# Patient Record
Sex: Male | Born: 1970 | Race: White | Marital: Single | Smoking: Never smoker
Health system: Southern US, Community
[De-identification: ages and names within clinical notes are randomized; demographics above are authoritative.]

## PROBLEM LIST (undated history)

## (undated) HISTORY — PX: ANKLE SURGERY: SHX546

---

## 2015-12-24 ENCOUNTER — Other Ambulatory Visit: Payer: Self-pay | Admitting: Family Medicine

## 2015-12-24 ENCOUNTER — Ambulatory Visit
Admission: RE | Admit: 2015-12-24 | Discharge: 2015-12-24 | Disposition: A | Discharge status: Home / Self Care | Payer: BC Managed Care – PPO | Source: Ambulatory Visit | Attending: Family Medicine | Admitting: Family Medicine

## 2015-12-24 DIAGNOSIS — R059 Cough, unspecified: Secondary | ICD-10-CM

## 2015-12-24 DIAGNOSIS — R05 Cough: Secondary | ICD-10-CM

## 2017-05-04 ENCOUNTER — Encounter: Payer: Self-pay | Admitting: Pulmonary Disease

## 2017-05-04 ENCOUNTER — Ambulatory Visit (INDEPENDENT_AMBULATORY_CARE_PROVIDER_SITE_OTHER): Payer: BC Managed Care – PPO | Admitting: Pulmonary Disease

## 2017-05-04 ENCOUNTER — Ambulatory Visit (INDEPENDENT_AMBULATORY_CARE_PROVIDER_SITE_OTHER)
Admission: RE | Admit: 2017-05-04 | Discharge: 2017-05-04 | Disposition: A | Discharge status: Home / Self Care | Payer: BC Managed Care – PPO | Source: Ambulatory Visit | Attending: Pulmonary Disease | Admitting: Pulmonary Disease

## 2017-05-04 VITALS — BP 124/78 | HR 67 | Ht 73.0 in | Wt 198.4 lb

## 2017-05-04 DIAGNOSIS — R059 Cough, unspecified: Secondary | ICD-10-CM

## 2017-05-04 DIAGNOSIS — R05 Cough: Secondary | ICD-10-CM | POA: Diagnosis not present

## 2017-05-04 LAB — NITRIC OXIDE: Nitric Oxide: 18

## 2017-05-04 MED ORDER — CHLORPHENIRAMINE MALEATE 4 MG PO TABS: 8.0000 mg | ORAL_TABLET | Freq: Three times a day (TID) | ORAL | 1 refills | Status: DC

## 2017-05-04 MED ORDER — AZELASTINE HCL 0.1 % NA SOLN: 2.0000 | Freq: Every day | NASAL | 12 refills | Status: DC

## 2017-05-04 NOTE — Patient Instructions (Signed)
We will start him chlorpheniramine 8 mg 3 times daily and Astelin nasal spray Get chest x-ray and pulmonary function test for further evaluation of the lung function Follow-up in 1 month.

## 2017-05-04 NOTE — Progress Notes (Signed)
Travis Fuller    [id number]    May 22, 1971  Primary Care Physician:Morrow, Clifton CustardAaron, MD  Referring Physician: Farris HasMorrow, Aaron, MD [address] [place], [place] [address]  Chief complaint: Consult for evaluation of chronic cough  HPI: 46 year old with significant past medical history.  He has complaints of persistent cough for the past 1.5 years.  The cough is nonproductive in nature.  Denies any dyspnea, wheezing, gas production, fevers, chills, hemoptysis He does not have any symptoms of allergies, GERD.Marland Kitchen.  He snores occasionally at night and does not have any symptoms of daytime sleepiness.  Pets: Cat and dog.  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 Exposures: No known exposures, no mold at home.  He has hardwood floors, forced air heating Smoking history: Never smoker, no exposure to secondhand smoke Travel History: Travel to [place] 2 months ago for work.  [place] in [place] for the past [place] years.  Grew up in [place].  Outpatient Encounter Prescriptions as of 05/04/2017  Medication Sig  . acetaminophen (TYLENOL) 500 MG tablet Take 500 mg by mouth every 6 (six) hours as needed.   No facility-administered encounter medications on file as of 05/04/2017.     Allergies as of 05/04/2017  . (Not on File)    No past medical history on file.  Past Surgical History:  Procedure Laterality Date  . ANKLE SURGERY      Family History  Problem Relation Age of Onset  . Breast cancer Maternal Aunt   . Emphysema Maternal Uncle     Social History   Social History  . Marital status: Single    Spouse name: N/A  . Number of children: N/A  . Years of education: N/A   Occupational History  . Not on file.   Social History Main Topics  . Smoking status: Never Smoker  . Smokeless tobacco: Never Used  . Alcohol use Yes     Comment: occ  . Drug use: No  . Sexual activity: Not on file   Other Topics Concern  .  Not on file   Social History Narrative  . No narrative on file    Review of systems: Review of Systems  Constitutional: Negative for fever and chills.  HENT: Negative.   Eyes: Negative for blurred vision.  Respiratory: as per HPI  Cardiovascular: Negative for chest pain and palpitations.  Gastrointestinal: Negative for vomiting, diarrhea, blood per rectum. Genitourinary: Negative for dysuria, urgency, frequency and hematuria.  Musculoskeletal: Negative for myalgias, back pain and joint pain.  Skin: Negative for itching and rash.  Neurological: Negative for dizziness, tremors, focal weakness, seizures and loss of consciousness.  Endo/Heme/Allergies: Negative for environmental allergies.  Psychiatric/Behavioral: Negative for depression, suicidal ideas and hallucinations.  All other systems reviewed and are negative.  Physical Exam: Blood pressure 124/78, pulse 67, height 6\' 1"  (1.854 m), weight 198 lb 6.4 oz (90 kg), SpO2 98 %. Gen:      No acute distress HEENT:  EOMI, sclera anicteric Neck:     No masses; no thyromegaly Lungs:    Clear to auscultation bilaterally; normal respiratory effort CV:         Regular rate and rhythm; no murmurs Abd:      + bowel sounds; soft, non-tender; no palpable masses, no distension Ext:    No edema; adequate peripheral perfusion Skin:      Warm and dry; no rash Neuro: alert and oriented x 3  Psych: normal mood and affect  Data Reviewed: FENO 05/04/17- 18  Chest x-ray 12/24/15-mild infrahilar prominence possible bronchitis. I reviewed the images personally.  Assessment:  Evaluation for chronic cough Suspect upper airway cough syndrome from postnasal drip and/or silent reflux Suspicion for asthma, COPD is low.  FENO is normal We will treat the postnasal drip with chlorpheniramine antihistamine and Astelin nasal spray. Check chest x-ray and PFTs Reevaluate in 1 month.  If his symptoms are persistent we may consider antiacid medication for  silent reflux.  Plan/Recommendations: - Chest x-ray, PFTs - Chlorphentermine, Astelin nasal spray.  Chilton Greathouse MD [facility] Pager [phone] 05/04/2017, 10:22 AM  CC: Farris Has, MD

## 2017-06-14 ENCOUNTER — Ambulatory Visit: Payer: BC Managed Care – PPO | Admitting: Pulmonary Disease

## 2017-07-21 ENCOUNTER — Ambulatory Visit: Payer: BC Managed Care – PPO | Admitting: Pulmonary Disease

## 2017-08-15 ENCOUNTER — Ambulatory Visit: Payer: BC Managed Care – PPO | Admitting: Pulmonary Disease

## 2017-09-13 ENCOUNTER — Ambulatory Visit (INDEPENDENT_AMBULATORY_CARE_PROVIDER_SITE_OTHER): Payer: BC Managed Care – PPO | Admitting: Pulmonary Disease

## 2017-09-13 ENCOUNTER — Ambulatory Visit: Payer: BC Managed Care – PPO | Admitting: Pulmonary Disease

## 2017-09-13 ENCOUNTER — Encounter: Payer: Self-pay | Admitting: Pulmonary Disease

## 2017-09-13 ENCOUNTER — Other Ambulatory Visit (INDEPENDENT_AMBULATORY_CARE_PROVIDER_SITE_OTHER): Payer: BC Managed Care – PPO

## 2017-09-13 VITALS — BP 128/70 | HR 95 | Ht 73.0 in | Wt 200.0 lb

## 2017-09-13 DIAGNOSIS — R059 Cough, unspecified: Secondary | ICD-10-CM

## 2017-09-13 DIAGNOSIS — R05 Cough: Secondary | ICD-10-CM

## 2017-09-13 LAB — PULMONARY FUNCTION TEST
DL/VA % PRED: 92 %
DL/VA: 4.38 ml/min/mmHg/L
DLCO UNC % PRED: 105 %
DLCO unc: 35.9 ml/min/mmHg
FEF 25-75 Post: 5.31 L/sec
FEF 25-75 Pre: 3.27 L/sec
FEF2575-%CHANGE-POST: 62 %
FEF2575-%PRED-POST: 143 %
FEF2575-%Pred-Pre: 88 %
FEV1-%CHANGE-POST: 14 %
FEV1-%PRED-PRE: 104 %
FEV1-%Pred-Post: 119 %
FEV1-POST: 4.97 L
FEV1-Pre: 4.33 L
FEV1FVC-%CHANGE-POST: 4 %
FEV1FVC-%Pred-Pre: 94 %
FEV6-%Change-Post: 9 %
FEV6-%Pred-Post: 123 %
FEV6-%Pred-Pre: 113 %
FEV6-PRE: 5.82 L
FEV6-Post: 6.36 L
FEV6FVC-%Change-Post: 0 %
FEV6FVC-%PRED-PRE: 102 %
FEV6FVC-%Pred-Post: 102 %
FVC-%CHANGE-POST: 9 %
FVC-%PRED-POST: 120 %
FVC-%PRED-PRE: 110 %
FVC-PRE: 5.87 L
FVC-Post: 6.43 L
POST FEV1/FVC RATIO: 77 %
PRE FEV6/FVC RATIO: 99 %
Post FEV6/FVC ratio: 99 %
Pre FEV1/FVC ratio: 74 %
RV % PRED: 104 %
RV: 2.16 L
TLC % pred: 123 %
TLC: 8.93 L

## 2017-09-13 LAB — CBC WITH DIFFERENTIAL/PLATELET
BASOS PCT: 0.6 % (ref 0.0–3.0)
Basophils Absolute: 0 10*3/uL (ref 0.0–0.1)
EOS ABS: 0.1 10*3/uL (ref 0.0–0.7)
EOS PCT: 1.4 % (ref 0.0–5.0)
HEMATOCRIT: 43.2 % (ref 39.0–52.0)
Hemoglobin: 14.7 g/dL (ref 13.0–17.0)
LYMPHS PCT: 34.4 % (ref 12.0–46.0)
Lymphs Abs: 2.6 10*3/uL (ref 0.7–4.0)
MCHC: 34.1 g/dL (ref 30.0–36.0)
MCV: 92.2 fl (ref 78.0–100.0)
Monocytes Absolute: 0.7 10*3/uL (ref 0.1–1.0)
Monocytes Relative: 9 % (ref 3.0–12.0)
NEUTROS ABS: 4.1 10*3/uL (ref 1.4–7.7)
Neutrophils Relative %: 54.6 % (ref 43.0–77.0)
PLATELETS: 284 10*3/uL (ref 150.0–400.0)
RBC: 4.69 Mil/uL (ref 4.22–5.81)
RDW: 13.3 % (ref 11.5–15.5)
WBC: 7.5 10*3/uL (ref 4.0–10.5)

## 2017-09-13 LAB — NITRIC OXIDE: Nitric Oxide: 21

## 2017-09-13 MED ORDER — FLUTICASONE FUROATE-VILANTEROL 200-25 MCG/INH IN AEPB: 1.0000 | INHALATION_SPRAY | Freq: Every day | RESPIRATORY_TRACT | 6 refills | Status: AC

## 2017-09-13 MED ORDER — FLUTICASONE FUROATE-VILANTEROL 200-25 MCG/INH IN AEPB: 1.0000 | INHALATION_SPRAY | Freq: Every day | RESPIRATORY_TRACT | 0 refills | Status: AC

## 2017-09-13 NOTE — Progress Notes (Signed)
Patient completed full PFT today. 

## 2017-09-13 NOTE — Progress Notes (Signed)
Travis AuDaniel Fuller    [id number]    06-14-1971  Primary Care Physician:Morrow, Clifton CustardAaron, MD  Referring Physician: Farris HasMorrow, Aaron, MD [address] [place], [place] [address]  Chief complaint: Follow up for chronic cough  HPI: 47 year old with significant past medical history.  He has complaints of persistent cough for the past 1.5 years.  The cough is nonproductive in nature.  Denies any dyspnea, wheezing, gas production, fevers, chills, hemoptysis He does not have any symptoms of allergies, GERD.Marland Kitchen.  He snores occasionally at night and does not have any symptoms of daytime sleepiness.  Pets: Cat and dog.  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 Exposures: No known exposures, no mold at home.  He has hardwood floors, forced air heating Smoking history: Never smoker, no exposure to secondhand smoke Travel History: Travel to [place] 2 months ago for work.  [place] in [place] for the past [place] years.  Grew up in [place].  Interim history: Has tried chlorpheniramine and Astelin nasal spray and reports small improvement in cough but the symptoms persist Denies any dyspnea, wheezing, mucus production.  Outpatient Encounter Medications as of 09/13/2017  Medication Sig  . acetaminophen (TYLENOL) 500 MG tablet Take 500 mg by mouth every 6 (six) hours as needed.  Marland Kitchen. azelastine (ASTELIN) 0.1 % nasal spray Place 2 sprays into both nostrils daily. Use in each nostril as directed  . chlorpheniramine (CHLORPHEN) 4 MG tablet Take 2 tablets (8 mg total) by mouth 3 (three) times daily. (Patient not taking: Reported on 09/13/2017)   No facility-administered encounter medications on file as of 09/13/2017.     Allergies as of 09/13/2017  . (Not on File)    No past medical history on file.  Past Surgical History:  Procedure Laterality Date  . ANKLE SURGERY      Family History  Problem Relation Age of Onset  . Breast cancer Maternal Aunt    . Emphysema Maternal Uncle     Social History   Socioeconomic History  . Marital status: Single    Spouse name: Not on file  . Number of children: Not on file  . Years of education: Not on file  . Highest education level: Not on file  Social Needs  . Financial resource strain: Not on file  . Food insecurity - worry: Not on file  . Food insecurity - inability: Not on file  . Transportation needs - medical: Not on file  . Transportation needs - non-medical: Not on file  Occupational History  . Not on file  Tobacco Use  . Smoking status: Never Smoker  . Smokeless tobacco: Never Used  Substance and Sexual Activity  . Alcohol use: Yes    Comment: occ  . Drug use: No  . Sexual activity: Not on file  Other Topics Concern  . Not on file  Social History Narrative  . Not on file    Review of systems: Review of Systems  Constitutional: Negative for fever and chills.  HENT: Negative.   Eyes: Negative for blurred vision.  Respiratory: as per HPI  Cardiovascular: Negative for chest pain and palpitations.  Gastrointestinal: Negative for vomiting, diarrhea, blood per rectum. Genitourinary: Negative for dysuria, urgency, frequency and hematuria.  Musculoskeletal: Negative for myalgias, back pain and joint pain.  Skin: Negative for itching and rash.  Neurological: Negative for dizziness, tremors, focal weakness, seizures and loss of consciousness.  Endo/Heme/Allergies: Negative for environmental allergies.  Psychiatric/Behavioral:  Negative for depression, suicidal ideas and hallucinations.  All other systems reviewed and are negative.  Physical Exam: Blood pressure 128/70, pulse 95, height 6\' 1"  (1.854 m), weight 200 lb (90.7 kg), SpO2 96 %. Gen:      No acute distress HEENT:  EOMI, sclera anicteric Neck:     No masses; no thyromegaly Lungs:    Clear to auscultation bilaterally; normal respiratory effort CV:         Regular rate and rhythm; no murmurs Abd:      + bowel  sounds; soft, non-tender; no palpable masses, no distension Ext:    No edema; adequate peripheral perfusion Skin:      Warm and dry; no rash Neuro: alert and oriented x 3 Psych: normal mood and affect  Data Reviewed: FENO 05/04/17- 18 FENO 09/13/17- 21  Chest x-ray 12/24/15-mild infrahilar prominence possible bronchitis. Chest x-ray 05/04/17-no active cardiopulmonary disease I reviewed the images personally.  PFTs 09/13/17 FVC 6.43 [120%], FEV1 4.97 [119%], F/F 77, TLC 123%, DLCO 105% Small airways disease with improvement in flow rates post bronchodilator.  Assessment:  Evaluation for chronic cough, asthmatic bronchitis Has tried antihistamine and Astelin nasal spray without significant improvement PFTs reviewed which show small airways disease with bronchodilator response suggestive of reactive airway disease, asthma We will try him on Breo inhaler, check CBC differential and blood allergy profile He does not have symptoms of acid reflux hence we will defer antiacid therapy for now.  Plan/Recommendations: - Check CBC differential, blood allergy profile - Start Breo inhaler.  Chilton Greathouse MD [facility] Pager [phone] 09/13/2017, 3:31 PM  CC: Farris Has, MD

## 2017-09-13 NOTE — Patient Instructions (Signed)
Started on Breo 200 Your lung function test indicate that you may have mild asthma We will get some blood test today including CBC differential and a blood allergy profile to further evaluate asthma Follow-up in 3 months.

## 2017-09-14 LAB — RESPIRATORY ALLERGY PROFILE REGION II ~~LOC~~
Allergen, D pternoyssinus,d7: 1.17 kU/L — ABNORMAL HIGH
Allergen, Mouse Urine Protein, e78: <0.1 kU/L
Allergen, Oak,t7: <0.1 kU/L
Bermuda Grass: <0.1 kU/L
CLASS: 0
CLASS: 0
CLASS: 0
CLASS: 0
CLASS: 0
CLASS: 0
CLASS: 0
CLASS: 0
CLASS: 0
CLASS: 2
CLASS: 2
COMMON RAGWEED (SHORT) (W1) IGE: <0.1 kU/L
Class: 0
Class: 0
Class: 0
Class: 0
Class: 0
Class: 0
Class: 0
Class: 0
Class: 0
Class: 0
Class: 0
Class: 0
Class: 0
Cockroach: <0.1 kU/L
D. FARINAE: 1.37 kU/L — AB
IGE (IMMUNOGLOBULIN E), SERUM: 12 kU/L (ref <=114)
Pecan/Hickory Tree IgE: <0.1 kU/L
Sheep Sorrel IgE: <0.1 kU/L
Timothy Grass: <0.1 kU/L

## 2017-09-14 LAB — INTERPRETATION:

## 2017-12-04 ENCOUNTER — Encounter (HOSPITAL_COMMUNITY): Payer: Self-pay | Admitting: *Deleted

## 2017-12-04 ENCOUNTER — Emergency Department (HOSPITAL_COMMUNITY): Payer: BC Managed Care – PPO

## 2017-12-04 ENCOUNTER — Emergency Department (HOSPITAL_COMMUNITY)
Admission: EM | Admit: 2017-12-04 | Discharge: 2017-12-04 | Disposition: A | Discharge status: Home / Self Care | Payer: BC Managed Care – PPO | Attending: Emergency Medicine | Admitting: Emergency Medicine

## 2017-12-04 DIAGNOSIS — K5732 Diverticulitis of large intestine without perforation or abscess without bleeding: Secondary | ICD-10-CM | POA: Diagnosis not present

## 2017-12-04 DIAGNOSIS — R109 Unspecified abdominal pain: Secondary | ICD-10-CM | POA: Diagnosis present

## 2017-12-04 LAB — COMPREHENSIVE METABOLIC PANEL
ALBUMIN: 4.3 g/dL (ref 3.5–5.0)
ALK PHOS: 37 U/L — AB (ref 38–126)
ALT: 15 U/L — ABNORMAL LOW (ref 17–63)
ANION GAP: 9 (ref 5–15)
AST: 17 U/L (ref 15–41)
BUN: 16 mg/dL (ref 6–20)
CO2: 23 mmol/L (ref 22–32)
Calcium: 9.4 mg/dL (ref 8.9–10.3)
Chloride: 105 mmol/L (ref 101–111)
Creatinine, Ser: 0.89 mg/dL (ref 0.61–1.24)
GFR calc Af Amer: >60 mL/min (ref >60)
GFR calc non Af Amer: >60 mL/min (ref >60)
GLUCOSE: 118 mg/dL — AB (ref 65–99)
POTASSIUM: 4.3 mmol/L (ref 3.5–5.1)
SODIUM: 137 mmol/L (ref 135–145)
Total Bilirubin: 0.6 mg/dL (ref 0.3–1.2)
Total Protein: 7.1 g/dL (ref 6.5–8.1)

## 2017-12-04 LAB — CBC WITH DIFFERENTIAL/PLATELET
BASOS ABS: 0 10*3/uL (ref 0.0–0.1)
Basophils Relative: 0 %
Eosinophils Absolute: 0.2 10*3/uL (ref 0.0–0.7)
Eosinophils Relative: 1 %
HCT: 40.5 % (ref 39.0–52.0)
HEMOGLOBIN: 13.9 g/dL (ref 13.0–17.0)
LYMPHS PCT: 10 %
Lymphs Abs: 1.5 10*3/uL (ref 0.7–4.0)
MCH: 31.8 pg (ref 26.0–34.0)
MCHC: 34.3 g/dL (ref 30.0–36.0)
MCV: 92.7 fL (ref 78.0–100.0)
MONO ABS: 1.8 10*3/uL — AB (ref 0.1–1.0)
Monocytes Relative: 12 %
NEUTROS ABS: 11.6 10*3/uL — AB (ref 1.7–7.7)
Neutrophils Relative %: 77 %
PLATELETS: 305 10*3/uL (ref 150–400)
RBC: 4.37 MIL/uL (ref 4.22–5.81)
RDW: 12.7 % (ref 11.5–15.5)
WBC: 15.1 10*3/uL — ABNORMAL HIGH (ref 4.0–10.5)

## 2017-12-04 LAB — LIPASE, BLOOD: Lipase: 32 U/L (ref 11–51)

## 2017-12-04 MED ORDER — OXYCODONE-ACETAMINOPHEN 5-325 MG PO TABS: 1.0000 | ORAL_TABLET | ORAL | 0 refills | Status: AC | PRN

## 2017-12-04 MED ORDER — KETOROLAC TROMETHAMINE 15 MG/ML IJ SOLN
15.0000 mg | Freq: Once | INTRAMUSCULAR | Status: AC
  Administered 2017-12-04: 15 mg via INTRAVENOUS
  Filled 2017-12-04: qty 1

## 2017-12-04 MED ORDER — METRONIDAZOLE 500 MG PO TABS
500.0000 mg | ORAL_TABLET | Freq: Once | ORAL | Status: AC
  Administered 2017-12-04: 500 mg via ORAL
  Filled 2017-12-04: qty 1

## 2017-12-04 MED ORDER — CIPROFLOXACIN HCL 500 MG PO TABS
500.0000 mg | ORAL_TABLET | Freq: Two times a day (BID) | ORAL | 0 refills | Status: AC
Start: 2017-12-04 — End: ?

## 2017-12-04 MED ORDER — ONDANSETRON HCL 4 MG/2ML IJ SOLN
4.0000 mg | Freq: Once | INTRAMUSCULAR | Status: AC
  Administered 2017-12-04: 4 mg via INTRAVENOUS
  Filled 2017-12-04: qty 2

## 2017-12-04 MED ORDER — IOPAMIDOL (ISOVUE-300) INJECTION 61%
INTRAVENOUS | Status: AC
  Filled 2017-12-04: qty 100

## 2017-12-04 MED ORDER — HYDROMORPHONE HCL 1 MG/ML IJ SOLN
1.0000 mg | Freq: Once | INTRAMUSCULAR | Status: AC
  Administered 2017-12-04: 1 mg via INTRAVENOUS
  Filled 2017-12-04: qty 1

## 2017-12-04 MED ORDER — METRONIDAZOLE 500 MG PO TABS: 500.0000 mg | ORAL_TABLET | Freq: Three times a day (TID) | ORAL | 0 refills | Status: AC

## 2017-12-04 MED ORDER — CIPROFLOXACIN HCL 500 MG PO TABS
500.0000 mg | ORAL_TABLET | Freq: Once | ORAL | Status: AC
  Administered 2017-12-04: 500 mg via ORAL
  Filled 2017-12-04: qty 1

## 2017-12-04 MED ORDER — IOPAMIDOL (ISOVUE-300) INJECTION 61%
100.0000 mL | Freq: Once | INTRAVENOUS | Status: AC | PRN
Start: 2017-12-04 — End: 2017-12-04
  Administered 2017-12-04: 100 mL via INTRAVENOUS

## 2017-12-04 MED ORDER — MORPHINE SULFATE (PF) 4 MG/ML IV SOLN
4.0000 mg | Freq: Once | INTRAVENOUS | Status: AC
  Administered 2017-12-04: 4 mg via INTRAVENOUS
  Filled 2017-12-04: qty 1

## 2017-12-04 NOTE — Discharge Instructions (Signed)
Please take Cipro 500mg  twice daily and Flagyl three times daily for the next week Follow a clear liquid/soft diet for the next several days until your symptoms improve Take Percocet as needed for severe pain. This medicine can make you drowsy so do not drive while taking this. Follow up with GI Return if worsening

## 2017-12-04 NOTE — ED Notes (Signed)
ED Provider at bedside. 

## 2017-12-04 NOTE — ED Provider Notes (Signed)
[facility]-EMERGENCY DEPT Provider Note   CSN: [id number] Arrival date & time: 12/04/17  0711     History   Chief Complaint Chief Complaint  Patient presents with  . Abdominal Pain    HPI Travis Fuller is a 47 y.o. male who presents with abdominal pain.  No significant past medical history.  Patient states that yesterday he started having gradually worsening left lower quadrant pain.  It is sharp in nature and feels like he did a lot of sit ups.  He has never had pain like this before.  He cannot get in a comfortable position last night and felt feverish and nauseous.  This morning the pain did not improve therefore he decided to come to the emergency department.  He tried Tums with no relief.  The pain does not radiate.  He denies chest pain, shortness of breath, vomiting, diarrhea, constipation, urinary symptoms, testicular pain.  He denies history of prior abdominal surgeries or colonoscopy.  HPI  History reviewed. No pertinent past medical history.  There are no active problems to display for this patient.   Past Surgical History:  Procedure Laterality Date  . ANKLE SURGERY          Home Medications    Prior to Admission medications   Medication Sig Start Date End Date Taking? Authorizing Provider  acetaminophen (TYLENOL) 500 MG tablet Take 500 mg by mouth every 6 (six) hours as needed.    [provider]  azelastine (ASTELIN) 0.1 % nasal spray Place 2 sprays into both nostrils daily. Use in each nostril as directed 05/04/17   Mannam, Colbert CoyerPraveen, MD  chlorpheniramine (CHLORPHEN) 4 MG tablet Take 2 tablets (8 mg total) by mouth 3 (three) times daily. Patient not taking: Reported on 09/13/2017 05/04/17   Chilton GreathouseMannam, Praveen, MD  fluticasone furoate-vilanterol (BREO ELLIPTA) 200-25 MCG/INH AEPB Inhale 1 puff into the lungs daily. 09/13/17   Chilton GreathouseMannam, Praveen, MD    Family History Family History  Problem Relation Age of Onset  . Breast cancer Maternal  Aunt   . Emphysema Maternal Uncle     Social History Social History   Tobacco Use  . Smoking status: Never Smoker  . Smokeless tobacco: Never Used  Substance Use Topics  . Alcohol use: Yes    Comment: occ  . Drug use: No     Allergies   Patient has no allergy information on record.   Review of Systems Review of Systems  Constitutional: Positive for fever.  Respiratory: Negative for shortness of breath.   Cardiovascular: Negative for chest pain.  Gastrointestinal: Positive for abdominal pain and nausea. Negative for diarrhea and vomiting.  Genitourinary: Negative for dysuria, flank pain, hematuria and testicular pain.  All other systems reviewed and are negative.    Physical Exam Updated Vital Signs BP 122/73 (BP Location: Left Arm)   Pulse 93   Temp 99.2 F (37.3 C) (Oral)   Resp 20   Ht 6\' 2"  (1.88 m)   Wt 90.7 kg (200 lb)   SpO2 98%   BMI 25.68 kg/m   Physical Exam  Constitutional: He is oriented to person, place, and time. He appears well-developed and well-nourished. No distress.  HENT:  Head: Normocephalic and atraumatic.  Eyes: Pupils are equal, round, and reactive to light. Conjunctivae are normal. Right eye exhibits no discharge. Left eye exhibits no discharge. No scleral icterus.  Neck: Normal range of motion.  Cardiovascular: Normal rate and regular rhythm.  Pulmonary/Chest: Effort normal and breath sounds  normal. No respiratory distress.  Abdominal: Soft. Bowel sounds are normal. He exhibits no distension. There is tenderness (LLQ tenderness).  Genitourinary:  Genitourinary Comments: No inguinal lymphadenopathy or inguinal hernia noted. Normal circumcised penis free of lesions or rash. Testicles are nontender with normal lie. Normal scrotal appearance. No obvious discharge noted. Chaperone present during exam.   Neurological: He is alert and oriented to person, place, and time.  Skin: Skin is warm and dry.  Psychiatric: He has a normal mood and  affect. His behavior is normal.  Nursing note and vitals reviewed.    ED Treatments / Results  Labs (all labs ordered are listed, but only abnormal results are displayed) Labs Reviewed  CBC WITH DIFFERENTIAL/PLATELET - Abnormal; Notable for the following components:      Result Value   WBC 15.1 (*)    Neutro Abs 11.6 (*)    Monocytes Absolute 1.8 (*)    All other components within normal limits  COMPREHENSIVE METABOLIC PANEL - Abnormal; Notable for the following components:   Glucose, Bld 118 (*)    ALT 15 (*)    Alkaline Phosphatase 37 (*)    All other components within normal limits  LIPASE, BLOOD    EKG None  Radiology Ct Abdomen Pelvis W Contrast  Result Date: 12/04/2017 CLINICAL DATA:  Left lower quadrant abdominal pain since yesterday evening. EXAM: CT ABDOMEN AND PELVIS WITH CONTRAST TECHNIQUE: Multidetector CT imaging of the abdomen and pelvis was performed using the standard protocol following bolus administration of intravenous contrast. CONTRAST:  ISOVUE-300 IOPAMIDOL (ISOVUE-300) INJECTION 61% COMPARISON:  None. FINDINGS: Lower chest: Limited visualization of the lower thorax demonstrates minimal dependent subpleural ground-glass atelectasis. No discrete focal airspace opacities. No pleural effusion. Normal heart size.  No pericardial effusion. Hepatobiliary: Normal hepatic contour. No discrete hepatic lesions. No intra extrahepatic biliary duct dilatation. No ascites. Normal appearance of the gallbladder given degree distention. No radiopaque gallstones. No intra or extrahepatic biliary ductal dilatation. Pancreas: Normal appearance of the pancreas Spleen: Note is made of a punctate (approximately 1.3 cm) hypoattenuating (15 Hounsfield unit) cyst within the inferior aspect of the spleen (coronal image 73, series 5). No perisplenic stranding Adrenals/Urinary Tract: There is symmetric enhancement and excretion of the bilateral kidneys. No definite renal stones on this  postcontrast examination. No discrete renal lesions. No urine obstruction or perinephric stranding. Normal appearance of the bilateral adrenal glands. Normal appearance of the urinary bladder given degree of distention. Stomach/Bowel: Scattered colonic diverticulosis with ill-defined stranding adjacent to a prominent diverticuli within the distal descending colon/proximal sigmoid colon within the left lower abdomen/upper pelvis (image 66, series 2). No definable/drainable fluid collection. No pneumoperitoneum, pneumatosis or portal venous gas. Moderate colonic stool burden without evidence of enteric obstruction. Normal appearance of the terminal ileum and appendix. Vascular/Lymphatic: Normal caliber of the abdominal aorta. The major branch vessels of the abdominal aorta appear widely patent on this non CTA examination. Scattered retroperitoneal and mesenteric lymph nodes are numerous though individually not enlarged by size criteria with index aortocaval lymph node measuring 0.7 cm in greatest short axis diameter (image 40, series 2) and index mesenteric lymph node measuring 0.6 cm (image 48, series 2), presumably reactive in etiology. No bulky retroperitoneal, mesenteric, pelvic or inguinal lymphadenopathy. Reproductive: Normal appearance the prostate gland. Several phleboliths are seen with the lower pelvis bilaterally. No free fluid the pelvic cul-de-sac. Other: Regional soft tissues appear normal. Musculoskeletal: No acute or aggressive osseous abnormalities. Moderate to severe multilevel lumbar spine DDD, worse at  L4-L5 with disc space height loss, endplate irregularity and sclerosis. Bilateral facet degenerative change within the lower lumbar spine. IMPRESSION: 1. Findings compatible with acute uncomplicated diverticulitis involving the distal descending/proximal sigmoid colon within the left lower abdomen/upper pelvis. No evidence of perforation or definable/drainable fluid collection. No evidence of enteric  obstruction. 2. 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 Electronically Signed   By: Simonne Come M.D.   On: 12/04/2017 09:04    Procedures Procedures (including critical care time)  Medications Ordered in ED Medications  morphine 4 MG/ML injection 4 mg (4 mg Intravenous Given 12/04/17 0818)  ondansetron (ZOFRAN) injection 4 mg (4 mg Intravenous Given 12/04/17 0818)  iopamidol (ISOVUE-300) 61 % injection 100 mL (100 mLs Intravenous Contrast Given 12/04/17 0845)  HYDROmorphone (DILAUDID) injection 1 mg (1 mg Intravenous Given 12/04/17 0920)  ketorolac (TORADOL) 15 MG/ML injection 15 mg (15 mg Intravenous Given 12/04/17 0920)  metroNIDAZOLE (FLAGYL) tablet 500 mg (500 mg Oral Given 12/04/17 0925)  ciprofloxacin (CIPRO) tablet 500 mg (500 mg Oral Given 12/04/17 0925)     Initial Impression / Assessment and Plan / ED Course  I have reviewed the triage vital signs and the nursing notes.  Pertinent labs & imaging results that were available during my care of the patient were reviewed by me and considered in my medical decision making (see chart for details).  47 year old male presents with acute onset of left lower quadrant pain starting last night.  His vital signs are normal.  He has significant tenderness in the left lower quadrant.  Will obtain labs, provide pain control, obtain CT.   CBC is remarkable for leukocytosis of 15.1.  CMP is normal.  Lipase is normal.  He is still having significant pain after morphine.  Will give another round of pain medicine.  CT is remarkable for uncomplicated diverticulitis.  After second round of pain medicines he feels much more comfortable.  He was given a dose of Cipro and Flagyl in the ED and tolerated p.o.  He was given prescription for antibiotics and pain medicine for home. He was instructed to follow a clear liquid/soft diet for several days. He was given return precautions and referral to GI.  Final Clinical Impressions(s) / ED Diagnoses     Final diagnoses:  Diverticulitis large intestine w/o perforation or abscess w/o bleeding    ED Discharge Orders    None       Bethel Born, PA-C 12/04/17 1559    Gerhard Munch, MD 12/04/17 [phone]

## 2017-12-04 NOTE — ED Triage Notes (Signed)
Pt complains of LLQ pain since yesterday evening. Pt denies emesis/diarrhea/blood in stool. Last bowel movement was normal last night.

## 2017-12-04 NOTE — ED Notes (Signed)
Patient transported to CT 

## 2017-12-19 ENCOUNTER — Ambulatory Visit: Payer: BC Managed Care – PPO | Admitting: Pulmonary Disease

## 2019-07-09 IMAGING — CT CT ABD-PELV W/ CM
2 of 5 series · 15 of 46 positions shown, 17 images · IV contrast (ISOVUE)
Comparison: None.

CLINICAL DATA: Left lower quadrant abdominal pain since yesterday
evening.

EXAM:
CT ABDOMEN AND PELVIS WITH CONTRAST
TECHNIQUE: Multidetector CT imaging of the abdomen and pelvis was performed
using the standard protocol following bolus administration of
intravenous contrast.
CONTRAST:  100mL AMT9OT-9BB IOPAMIDOL (AMT9OT-9BB) INJECTION 61%

[Series 2: axial st · axial · 0.87mm/px · z∈[-628,-158]mm · 12 of 110 slices shown, 14 images]
[im 8/110  soft-tissue]
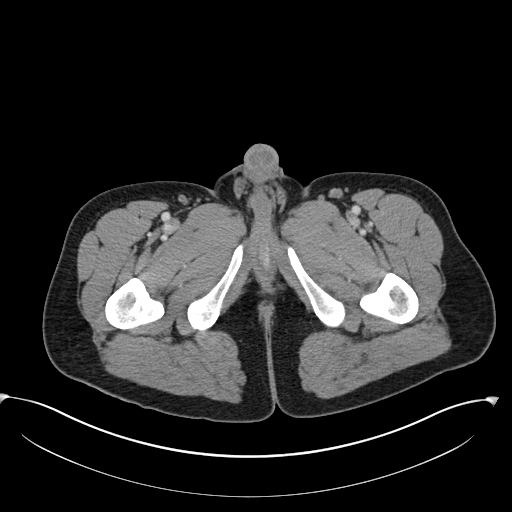
[im 8/110  bone]
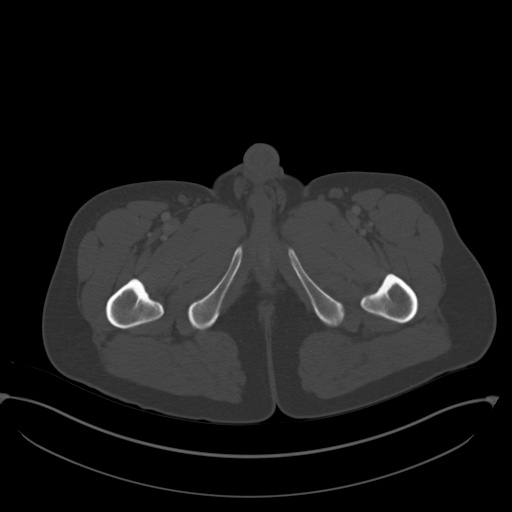
[im 16/110  soft-tissue]
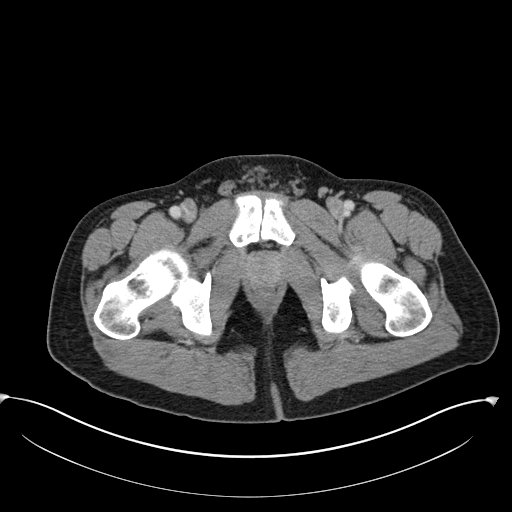
[im 24/110  soft-tissue]
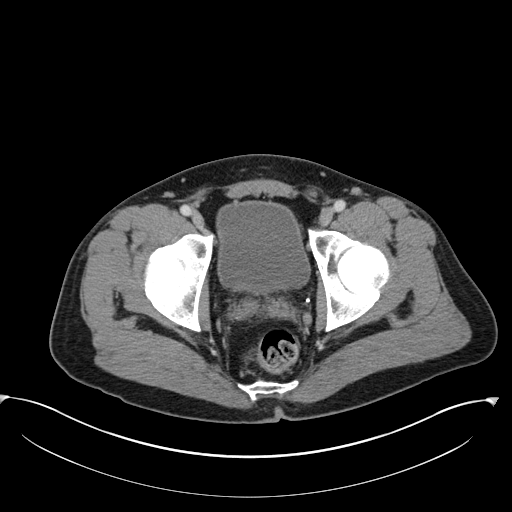
[im 32/110  soft-tissue]
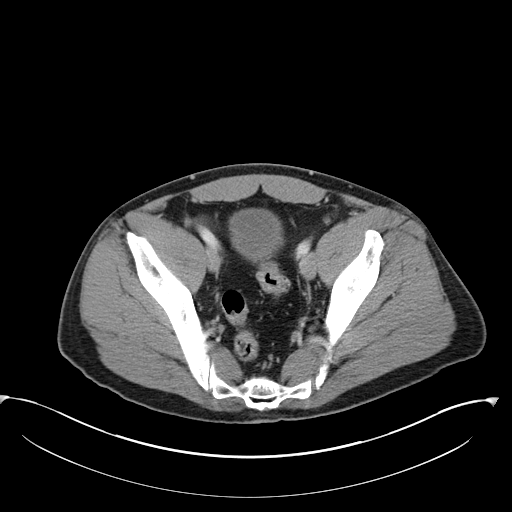
[im 39/110  soft-tissue]
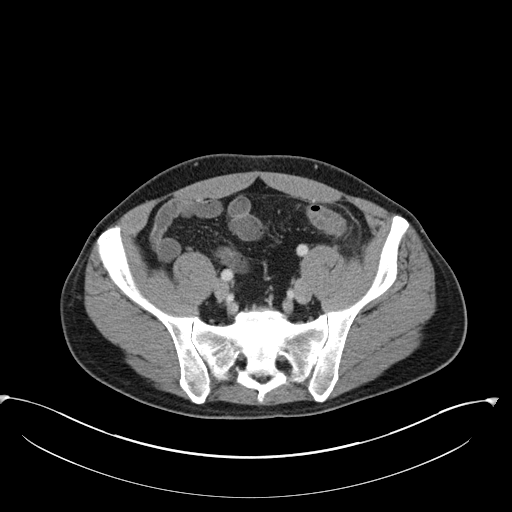
[im 47/110  soft-tissue]
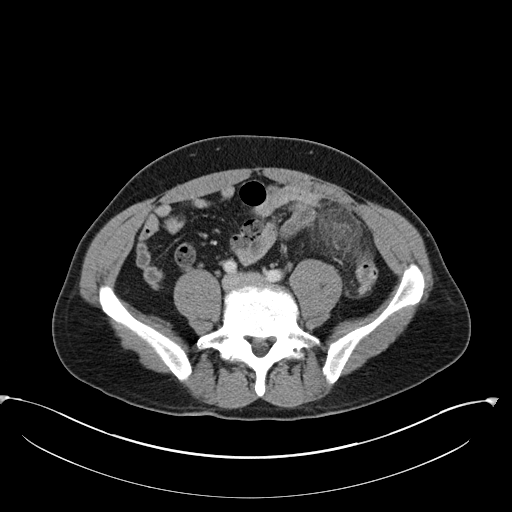
[im 63/110  soft-tissue]
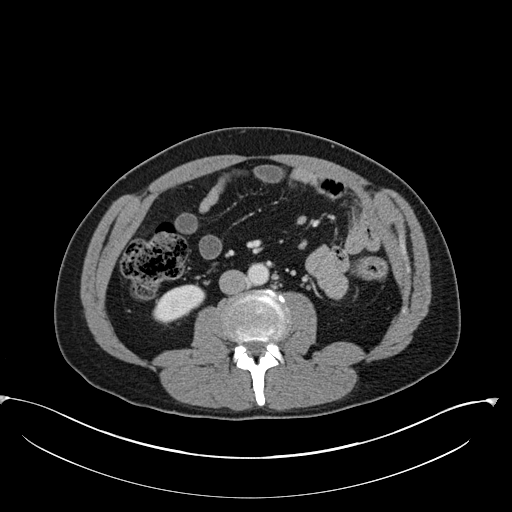
[im 71/110  soft-tissue]
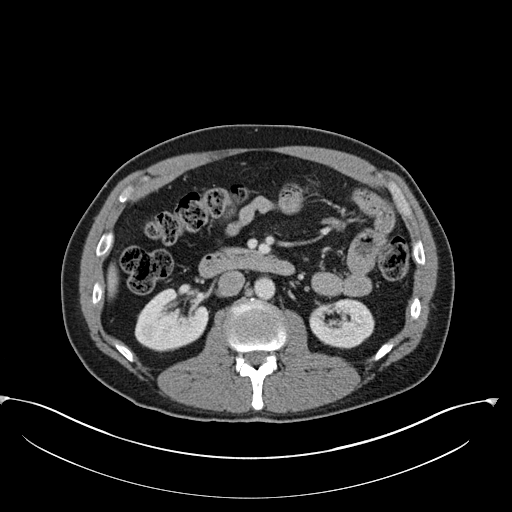
[im 78/110  soft-tissue]
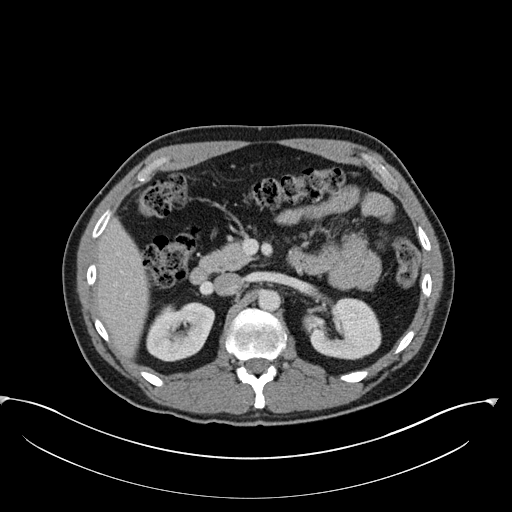
[im 78/110  bone]
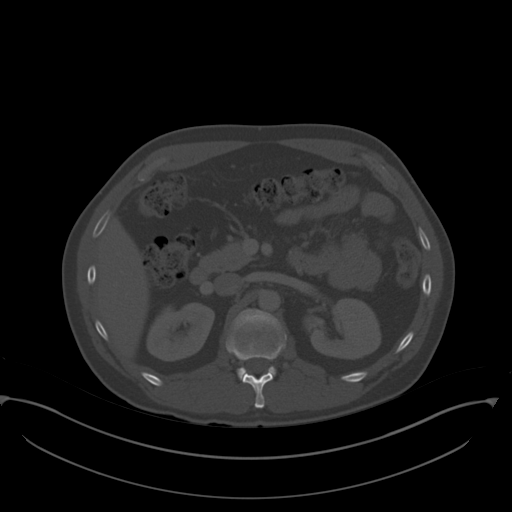
[im 86/110  soft-tissue]
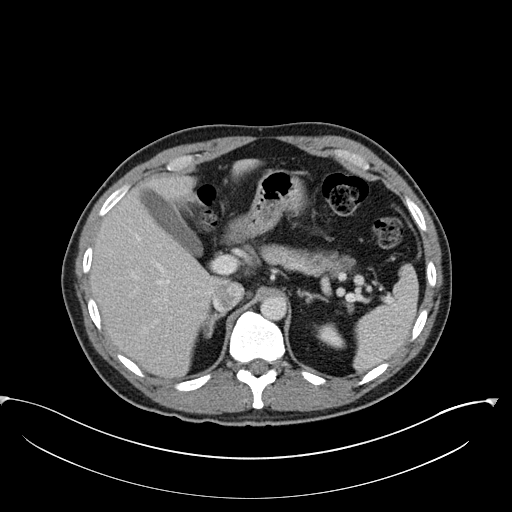
[im 94/110  soft-tissue]
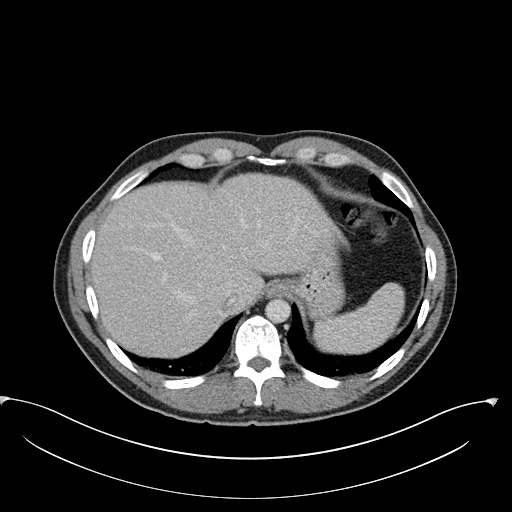
[im 102/110  soft-tissue]
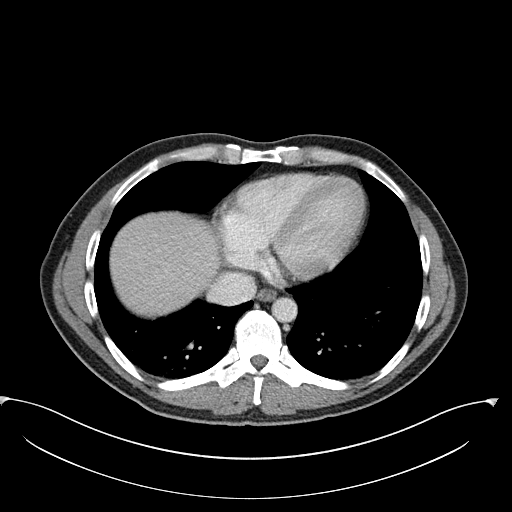

[Series 5: coronal st · coronal · 0.85mm/px · 3 of 97 slices shown]
[im 33/97  soft-tissue]
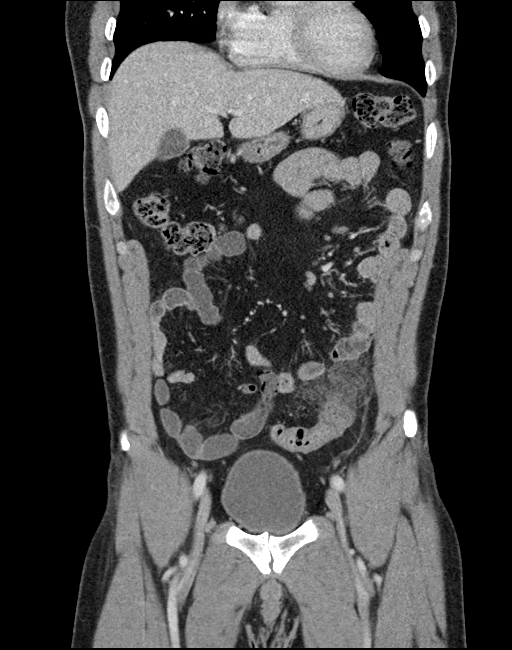
[im 43/97  soft-tissue]
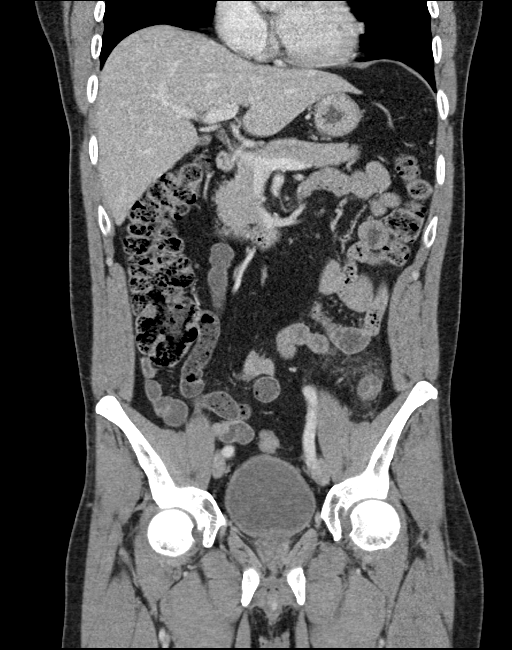
[im 54/97  soft-tissue]
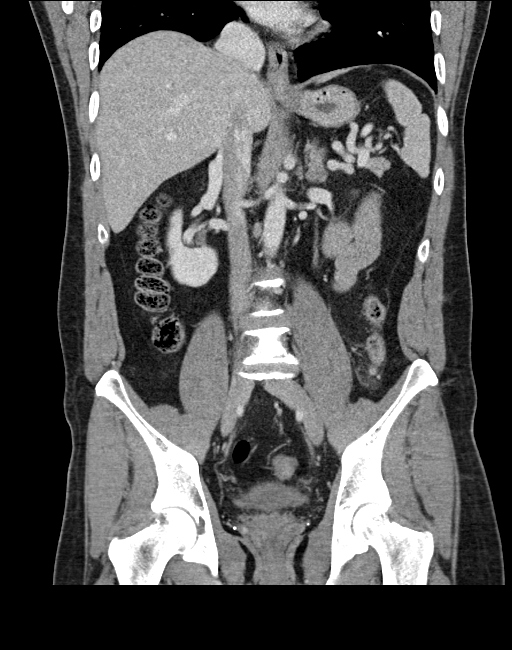

[15 of 46 positions shown; findings below may reference images not displayed]

FINDINGS: Lower chest: Limited visualization of the lower thorax demonstrates
minimal dependent subpleural ground-glass atelectasis. No discrete
focal airspace opacities. No pleural effusion.

Normal heart size.  No pericardial effusion.

Hepatobiliary: Normal hepatic contour. No discrete hepatic lesions.
No intra extrahepatic biliary duct dilatation. No ascites. Normal
appearance of the gallbladder given degree distention. No radiopaque
gallstones. No intra or extrahepatic biliary ductal dilatation.

Pancreas: Normal appearance of the pancreas

Spleen: Note is made of a punctate (approximately 1.3 cm)
hypoattenuating (15 Hounsfield unit) cyst within the inferior aspect
of the spleen (coronal image 73, series 5). No perisplenic stranding

Adrenals/Urinary Tract: There is symmetric enhancement and excretion
of the bilateral kidneys. No definite renal stones on this
postcontrast examination. No discrete renal lesions. No urine
obstruction or perinephric stranding.

Normal appearance of the bilateral adrenal glands.

Normal appearance of the urinary bladder given degree of distention.

Stomach/Bowel: Scattered colonic diverticulosis with ill-defined
stranding adjacent to a prominent diverticuli within the distal
descending colon/proximal sigmoid colon within the left lower
abdomen/upper pelvis (image 66, series 2). No definable/drainable
fluid collection. No pneumoperitoneum, pneumatosis or portal venous
gas.

Moderate colonic stool burden without evidence of enteric
obstruction. Normal appearance of the terminal ileum and appendix.

Vascular/Lymphatic: Normal caliber of the abdominal aorta. The major
branch vessels of the abdominal aorta appear widely patent on this
non CTA examination.

Scattered retroperitoneal and mesenteric lymph nodes are numerous
though individually not enlarged by size criteria with index
aortocaval lymph node measuring 0.7 cm in greatest short axis
diameter (image 40, series 2) and index mesenteric lymph node
measuring 0.6 cm (image 48, series 2), presumably reactive in
etiology. No bulky retroperitoneal, mesenteric, pelvic or inguinal
lymphadenopathy.

Reproductive: Normal appearance the prostate gland. Several
phleboliths are seen with the lower pelvis bilaterally. No free
fluid the pelvic cul-de-sac.

Other: Regional soft tissues appear normal.

Musculoskeletal: No acute or aggressive osseous abnormalities.
Moderate to severe multilevel lumbar spine DDD, worse at L4-L5 with
disc space height loss, endplate irregularity and sclerosis.
Bilateral facet degenerative change within the lower lumbar spine.
IMPRESSION: 1. Findings compatible with acute uncomplicated diverticulitis
involving the distal descending/proximal sigmoid colon within the
left lower abdomen/upper pelvis. No evidence of perforation or
definable/drainable fluid collection. No evidence of enteric
obstruction.
2. Moderate to severe multilevel lumbar spine DDD, worse at L4-L5.

## 2020-07-16 DIAGNOSIS — Z23 Encounter for immunization: Secondary | ICD-10-CM | POA: Diagnosis not present

## 2020-07-16 DIAGNOSIS — Z125 Encounter for screening for malignant neoplasm of prostate: Secondary | ICD-10-CM | POA: Diagnosis not present

## 2020-07-16 DIAGNOSIS — E785 Hyperlipidemia, unspecified: Secondary | ICD-10-CM | POA: Diagnosis not present

## 2020-07-16 DIAGNOSIS — Z0000 Encounter for general adult medical examination without abnormal findings: Secondary | ICD-10-CM | POA: Diagnosis not present

## 2020-09-08 DIAGNOSIS — Z23 Encounter for immunization: Secondary | ICD-10-CM | POA: Diagnosis not present

## 2020-09-08 DIAGNOSIS — F418 Other specified anxiety disorders: Secondary | ICD-10-CM | POA: Diagnosis not present

## 2020-10-09 DIAGNOSIS — F418 Other specified anxiety disorders: Secondary | ICD-10-CM | POA: Diagnosis not present

## 2020-12-08 DIAGNOSIS — L03115 Cellulitis of right lower limb: Secondary | ICD-10-CM | POA: Diagnosis not present

## 2021-01-14 DIAGNOSIS — Z23 Encounter for immunization: Secondary | ICD-10-CM | POA: Diagnosis not present

## 2021-01-14 DIAGNOSIS — F418 Other specified anxiety disorders: Secondary | ICD-10-CM | POA: Diagnosis not present

## 2021-09-01 DIAGNOSIS — F418 Other specified anxiety disorders: Secondary | ICD-10-CM | POA: Diagnosis not present

## 2022-01-19 DIAGNOSIS — F411 Generalized anxiety disorder: Secondary | ICD-10-CM | POA: Diagnosis not present

## 2022-01-19 DIAGNOSIS — Z125 Encounter for screening for malignant neoplasm of prostate: Secondary | ICD-10-CM | POA: Diagnosis not present

## 2022-01-19 DIAGNOSIS — Z0000 Encounter for general adult medical examination without abnormal findings: Secondary | ICD-10-CM | POA: Diagnosis not present

## 2022-01-19 DIAGNOSIS — E785 Hyperlipidemia, unspecified: Secondary | ICD-10-CM | POA: Diagnosis not present

## 2022-07-23 DIAGNOSIS — E785 Hyperlipidemia, unspecified: Secondary | ICD-10-CM | POA: Diagnosis not present

## 2022-07-23 DIAGNOSIS — F418 Other specified anxiety disorders: Secondary | ICD-10-CM | POA: Diagnosis not present

## 2022-07-27 ENCOUNTER — Telehealth (HOSPITAL_COMMUNITY): Payer: Self-pay | Admitting: Psychiatry

## 2022-07-27 NOTE — Telephone Encounter (Signed)
D:  Patient called inquiring about [place].  Pt is a candidate for [place], according to questions answered.  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  Hx of therapy, no neurological condition, denies any implants and never received [place] or ECT before.  Onset of current depressive episode was 2 1/2 yrs ago whenever his mother passed.  "I don't feel like I'm functioning at all."    Scored 25 on the PHQ-9.  A:  Oriented pt.  Answered all his questions.  According to pt, he switched to a cheaper [employer] plan and thinks the new plan doesn't cover [place].  Pt is inquiring about the price of TMS, if paying out of pocket.  [facility] [employer] coordinator encouraged pt to call his insurance company to verify his benefits.  Pt will contact the coordinator once he talks to his insurance company first before scheduling consult appt with Dr. Fatima Sanger.  In the meantime, coordinator will inquire about the cost for someone wanting to pay out of pocket.  Inform the [place] team.  R:  Pt receptive.

## 2022-07-28 ENCOUNTER — Telehealth (HOSPITAL_COMMUNITY): Payer: Self-pay | Admitting: Psychiatry

## 2022-07-28 NOTE — Telephone Encounter (Signed)
D:  [employer] Coordinator spoke to management re: pt being able to self-pay for [place] services in case his insurance doesn't cover it.  According to Rosalie Doctor, (mgr) pt can't pay out of pocket b/c he has insurance.  She said it doesn't matter even if [place] isn't covered; it's the fact that he has insurance.  She added that pt can apply for financial assistance.  A:  Coordinator placed call to pt to inform him what the manager said.  According to pt he hadn't verified his insurance yet, but plans on doing it either today or tomorrow.  Encouraged pt to call the coordinator once he speaks to his insurance company; so she can tell him where to get an application for assistance.  Inform [place] team.  R:  Pt receptive.

## 2022-08-03 ENCOUNTER — Telehealth (HOSPITAL_COMMUNITY): Payer: Self-pay | Admitting: Psychiatry

## 2022-08-03 NOTE — Telephone Encounter (Signed)
D:  Pt called to inform [place] Coordinator that he attempted to contact his insurance company today and wasn't able to get a clear answer about [place] coverage.  "I will call again."  A:  Provided pt with support.  Encouraged pt to call early in the morning and maybe he will have better luck.  Inform [place] Team.  R:  Pt receptive.

## 2022-08-12 ENCOUNTER — Telehealth (HOSPITAL_COMMUNITY): Payer: Self-pay | Admitting: Psychiatry

## 2022-08-12 NOTE — Telephone Encounter (Signed)
D:  Interim [facility] [employer] Coordinator followed up with patient about him calling his insurance company Nurse, mental health) re: [facility] [employer] coverage; but there was no answer.  A:  Left vm asking if pt is still interested in [place] and to give the coordinator a call back if he is.  Inform [place] team.

## 2022-08-16 ENCOUNTER — Telehealth (HOSPITAL_COMMUNITY): Payer: Self-pay | Admitting: Psychiatry

## 2022-08-17 ENCOUNTER — Telehealth (HOSPITAL_COMMUNITY): Payer: Self-pay | Admitting: Psychiatry

## 2022-08-31 ENCOUNTER — Telehealth (HOSPITAL_COMMUNITY): Payer: Self-pay | Admitting: Psychiatry

## 2022-08-31 NOTE — Telephone Encounter (Signed)
D:  [place] Interim Coordinator returned call to pt.  Pt inquired about his consultation appt tomorrow at 3 pm.  "I can't remember how early should I arrive and what the address is?"  A:  Provided pt with support.  Answered all his questions.  Encouraged pt to arrive 15-20 minutes early and bring his insurance card.  Address provided.  Inform [place] team.  R:  Pt receptive.

## 2022-09-01 ENCOUNTER — Ambulatory Visit (HOSPITAL_COMMUNITY): Payer: Self-pay | Admitting: Student in an Organized Health Care Education/Training Program

## 2022-09-01 ENCOUNTER — Encounter (HOSPITAL_COMMUNITY): Payer: Self-pay | Admitting: Student in an Organized Health Care Education/Training Program

## 2022-09-01 VITALS — BP 126/87 | HR 75 | Ht 74.0 in | Wt 211.4 lb

## 2022-09-01 DIAGNOSIS — F411 Generalized anxiety disorder: Secondary | ICD-10-CM

## 2022-09-01 DIAGNOSIS — F329 Major depressive disorder, single episode, unspecified: Secondary | ICD-10-CM

## 2022-09-01 NOTE — Progress Notes (Signed)
BH MD/PA/NP OP Progress Note  09/01/2022 4:06 PM Travis Fuller  MRN:  [id number]  Chief Complaint:  Chief Complaint  Patient presents with   [place] Consult   HPI:  Travis Fuller is a 52 yr old male who presents for [place] Consult.  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  He reports that his depression and anxiety have been going on for most of his adult life with some fluctuations over time.  He reports he has been on multiple medication trials and every time it seems like he will have some initial improvement but then will worsen after a few months.  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  He reports that this led to an increase in stress and then not long after that his mother was diagnosed with rapid onset dementia.  He reports that this was during [place] so he was able to help take care of his mother at her home in [place] along with his brother and sister but that she did pass away about 3 years ago.  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  He reports that this is much less stressful but now he feels like he is not reaching his full potential.  He reports he is always had some troubles with social situations but feels like they are much worse and intensified now.  He reports he is currently on Cymbalta.  He reports past medication trials include Lexapro, Zoloft, Paxil, Effexor, Wellbutrin, Risperdal, and Xanax.  He reports past psychiatric history seen for depression and anxiety.  He reports no history of suicide attempts.  He reports no history of self-injurious behavior.  He reports no history of psychiatric hospitalizations.  He reports past medical history significant for hyperlipidemia.  He reports past surgical history significant for  tonsillectomy, wisdom teeth removal x 4, and left Achilles tendon repair.  He reports no history of head trauma or seizures.  He reports NKDA.  He reports currently lives in a house with his wife.  He works at [employer].  He reports a graduate high school.  He reports his undergrad degree is in [place] literature.  He reports drinking 1-2 beers a few times a week.  He reports no tobacco use.  He reports no illicit substance use.  He reports no current legal issues.  He reports no access to firearms.  Discussed [place] with him.  Discussed TMS process and study results on treatment resistant depression.  Discussed the first appointment and what is involved with finding the MT.  Discussed with him that afterwards treatment sessions would last approximately 20 minutes.  Discussed that treatments are Monday through Friday for 6 weeks and then there is a taper period.  Discussed that there is no definitive timeline for how long results would last but that typically we expect at least 1 year of improvement to consider additional treatments in the future.  Discussed that he should continue taking their medications as prescribed by their outpatient provider discussed that the tech would run the treatments afterwards with their parameters saved into the system, however, if any questions or issues arose I would be immediately available by phone or could be on site.  Discussed potential risks and side effects including but not limited to: Discomfort at  site of magnet placement, headache, or seizure.  Confirmed that there was no implanted/retained metal in the head (aneurysm clips, plates, screws).  Confirmed that there is no significant dental/bridge work.  Confirmed that there is no implanted pacemaker/defibrillator.  Discussed that jewelry should not be worn or removed from the ear for treatment.  Discussed that treatments are done at [facility].  Discussed that treatment room can have lights dimmed, music played, or have the  TV on during treatment sessions for comfort.  All questions were invited and answered. He were agreeable to proceed with MT appointment.  He reports no SI, HI, or AVH.   Visit Diagnosis:    ICD-10-CM   1. Treatment-resistant depression  F32.9     2. Generalized anxiety disorder  F41.1       Past Psychiatric History: Treatment Resistant Depression and Anxiety, and no history of Suicide Attempts, Self Injurious Behavior, or Psychiatric Hospitalizations.  Past Medical History: No past medical history on file.  Past Surgical History:  Procedure Laterality Date   ANKLE SURGERY      Family Psychiatric History: Brother- EtOH Abuse Maternal Cousin's Child- Completed Suicide  No Known Diagnosis'  Family History:  Family History  Problem Relation Age of Onset   Breast cancer Maternal Aunt    Emphysema Maternal Uncle     Social History:  Social History   Socioeconomic History   Marital status: Single    Spouse name: Not on file   Number of children: Not on file   Years of education: Not on file   Highest education level: Not on file  Occupational History   Not on file  Tobacco Use   Smoking status: Never   Smokeless tobacco: Never  Substance and Sexual Activity   Alcohol use: Yes    Comment: occ   Drug use: No   Sexual activity: Not on file  Other Topics Concern   Not on file  Social History Narrative   Not on file   Social Determinants of Health   Financial Resource Strain: Not on file  Food Insecurity: Not on file  Transportation Needs: Not on file  Physical Activity: Not on file  Stress: Not on file  Social Connections: Not on file    Allergies: No Known Allergies  Metabolic Disorder Labs: No results found for: "HGBA1C", "MPG" No results found for: "PROLACTIN" No results found for: "CHOL", "TRIG", "HDL", "CHOLHDL", "VLDL", "LDLCALC" No results found for: "TSH"  Therapeutic Level Labs: No results found for: "LITHIUM" No results found for:  "VALPROATE" No results found for: "CBMZ"  Current Medications: Current Outpatient Medications  Medication Sig Dispense Refill   ciprofloxacin (CIPRO) 500 MG tablet Take 1 tablet (500 mg total) by mouth 2 (two) times daily. 14 tablet 0   DM-Phenylephrine-Acetaminophen (VICKS DAYQUIL COLD & FLU) 10-5-325 MG CAPS Take 2 capsules by mouth daily as needed (cold symptoms).     fluticasone furoate-vilanterol (BREO ELLIPTA) 200-25 MCG/INH AEPB Inhale 1 puff into the lungs daily. 60 each 6   ibuprofen (ADVIL,MOTRIN) 200 MG tablet Take 400-600 mg by mouth every 6 (six) hours as needed for moderate pain.     metroNIDAZOLE (FLAGYL) 500 MG tablet Take 1 tablet (500 mg total) by mouth 3 (three) times daily. 21 tablet 0   oxyCODONE-acetaminophen (PERCOCET/ROXICET) 5-325 MG tablet Take 1-2 tablets by mouth every 4 (four) hours as needed for severe pain. 15 tablet 0   No current facility-administered medications for this visit.     Musculoskeletal: Strength &  Muscle Tone: within normal limits Gait & Station: normal Patient leans: N/A  Psychiatric Specialty Exam: Review of Systems  Respiratory:  Negative for cough and shortness of breath.   Cardiovascular:  Negative for chest pain.  Gastrointestinal:  Negative for abdominal pain, constipation, diarrhea, nausea and vomiting.  Neurological:  Negative for weakness and headaches.  Psychiatric/Behavioral:  Positive for dysphoric mood and sleep disturbance. Negative for hallucinations and suicidal ideas. The patient is nervous/anxious.     Blood pressure 126/87, pulse 75, height '6\' 2"'$  (1.88 m), weight 211 lb 6.4 oz (95.9 kg), SpO2 98 %.Body mass index is 27.14 kg/m.  General Appearance: Casual and Fairly Groomed  Eye Contact:  Good  Speech:  Clear and Coherent and Normal Rate  Volume:  Normal  Mood:  Anxious and Depressed  Affect:  Congruent and Depressed  Thought Process:  Coherent and Goal Directed  Orientation:  Full (Time, Place, and Person)   Thought Content: WDL and Logical   Suicidal Thoughts:  No  Homicidal Thoughts:  No  Memory:  Immediate;   Good Recent;   Good  Judgement:  Good  Insight:  Good  Psychomotor Activity:  Normal  Concentration:  Concentration: Good and Attention Span: Good  Recall:  Good  Fund of Knowledge: Good  Language: Good  Akathisia:  Negative  Handed:  Right  AIMS (if indicated): not done  Assets:  Communication Skills Desire for Improvement Financial Resources/Insurance Housing Physical Health Resilience Social Support  ADL's:  Intact  Cognition: WNL  Sleep:  Poor   Screenings: PHQ2-9    [place] Office Visit from 09/01/2022 in [facility]  PHQ-2 Total Score 6  PHQ-9 Total Score 20      [place] Office Visit from 09/01/2022 in [facility] [place] and Plan:  Tiernan Saade is a 52 yr old male who presents for [place] Consult.  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   Altariq would be a good candidate for [place] based on his PHQ-9 score of 20 and failed medication trials.  He is interested in receiving [place] so we will move forward with scheduling his MT appointment.   Collaboration of Care:   Patient/Guardian was advised Release of Information must be obtained prior to any record release in order to collaborate their care with an outside provider. Patient/Guardian was advised if they have not already done so to contact the registration department to sign all necessary forms in order for [place] to release information regarding their care.   Consent: Patient/Guardian gives verbal consent for treatment and assignment of benefits for services provided during this visit. Patient/Guardian expressed understanding and agreed to proceed.    Briant Cedar, MD 09/01/2022, 4:06 PM

## 2022-09-06 ENCOUNTER — Telehealth (HOSPITAL_COMMUNITY): Payer: Self-pay | Admitting: Psychiatry

## 2022-09-07 ENCOUNTER — Telehealth (HOSPITAL_COMMUNITY): Payer: Self-pay | Admitting: Psychiatry

## 2022-09-09 ENCOUNTER — Telehealth (HOSPITAL_COMMUNITY): Payer: Self-pay | Admitting: Psychiatry

## 2022-09-09 ENCOUNTER — Encounter (HOSPITAL_COMMUNITY): Payer: Self-pay | Admitting: *Deleted

## 2022-09-09 ENCOUNTER — Other Ambulatory Visit (HOSPITAL_COMMUNITY): Payer: Self-pay | Attending: Psychiatry | Admitting: *Deleted

## 2022-09-09 DIAGNOSIS — F332 Major depressive disorder, recurrent severe without psychotic features: Secondary | ICD-10-CM

## 2022-09-09 NOTE — Progress Notes (Unsigned)
Pt reported to [facility] for her cortical mapping and motor threshold determination for Repetitive Transcranial Magnetic Stimulation treatment for Major Depressive Disorder. Pt completed a PHQ-9 with a score of 23.  Prior to procedure, pt signed an informed consent agreement for [place] treatment. Pt's treatment area was found by applying single pulses to her left motor cortex, hunting along the anterior/posterior plane and along the superior oblique angle until the best motor response was elicited from the pt's right thumb. Using the [employer]'s proprietary MT Assist algorithm, which produced a calculated motor threshold of 1.12 SMT. Per these findings, pt's treatment parameter. With these parameters, the pt will receive 36 sessions of TMS according to the following protocol: 3000 pulses per session. After determining pt's tx parameters, coil was moved to the treatment location, and the first burst of pulses was applied at a reduced power of 75% MT. Pt was able to tolerate titration to 105% MT by the end of treatment. Pt reported no complaints, and stated that the stimulation was tolerable. Upon completion of mapping, pt completed a few treatment intervals for observation of side effects. Pt tolerated tx well. Pt departed from clinic without issue. Dr. Kai Levins completed cortical mapping. Final A/P positioning was 9.5, SOA 47, coil angle +10 degrees, LC +1.5.

## 2022-09-10 ENCOUNTER — Other Ambulatory Visit (HOSPITAL_COMMUNITY): Payer: BC Managed Care – PPO | Attending: Psychiatry | Admitting: *Deleted

## 2022-09-10 DIAGNOSIS — F332 Major depressive disorder, recurrent severe without psychotic features: Secondary | ICD-10-CM

## 2022-09-10 NOTE — Progress Notes (Cosign Needed)
Pt reported to [facility] for his 2nd [place] treatment for Major Depressive Disorder. Pt presented with an appropriate bright affect. We started today's treatment at a power of 90% MT. Pt tolerated treatment well, ending at 120% for the last minute of treatment. Pt reported having no headaches yesterday, and no difficulties or discomfort after treatment.

## 2022-09-10 NOTE — Patient Instructions (Signed)
Pt reported to [facility] for his 2nd [place] treatment for Major Depressive Disorder. Pt presented with an appropriate bright affect. We started today's treatment at a power of 90% MT. Pt tolerated treatment well, ending at 120% for the last minute of treatment. Pt reported having no headaches yesterday, and no difficulties or discomfort after treatment. Travis Fuller

## 2022-09-13 ENCOUNTER — Other Ambulatory Visit (HOSPITAL_COMMUNITY): Payer: BC Managed Care – PPO | Attending: Psychiatry | Admitting: *Deleted

## 2022-09-13 DIAGNOSIS — F332 Major depressive disorder, recurrent severe without psychotic features: Secondary | ICD-10-CM | POA: Diagnosis not present

## 2022-09-13 NOTE — Progress Notes (Cosign Needed)
Pt reported to [facility] for his 3rd [place] treatment for Major Depressive Disorder. Pt presented with bright affect, reported having a good weekend.  Pt was positioned in the chair based on measurements from his mapping. We started at 95% and ended today's treatment at a power of 120% MT. Pt reported no pain during treatment, though there was moderate twitching of the left eye and jaw. Pt left treatment stable and without complaint.

## 2022-09-14 ENCOUNTER — Other Ambulatory Visit (HOSPITAL_COMMUNITY): Payer: BC Managed Care – PPO | Attending: Psychiatry | Admitting: *Deleted

## 2022-09-14 DIAGNOSIS — F411 Generalized anxiety disorder: Secondary | ICD-10-CM | POA: Diagnosis not present

## 2022-09-14 DIAGNOSIS — F329 Major depressive disorder, single episode, unspecified: Secondary | ICD-10-CM | POA: Diagnosis not present

## 2022-09-14 DIAGNOSIS — F332 Major depressive disorder, recurrent severe without psychotic features: Secondary | ICD-10-CM

## 2022-09-14 NOTE — Progress Notes (Cosign Needed)
Pt reported to [facility] for his 4th [place] treatment for Major Depressive Disorder. Pt presented with bright affect, commenting on how lovely the weather is today.  Pt was positioned in the chair based on measurements from his mapping. We started at 105%% and ended today's treatment at a power of 120% MT. Pt reported no pain during treatment, though there was moderate twitching of the left eye and jaw. Pt left treatment stable and without complaint.

## 2022-09-15 ENCOUNTER — Other Ambulatory Visit (HOSPITAL_BASED_OUTPATIENT_CLINIC_OR_DEPARTMENT_OTHER): Payer: BC Managed Care – PPO | Admitting: *Deleted

## 2022-09-15 DIAGNOSIS — F329 Major depressive disorder, single episode, unspecified: Secondary | ICD-10-CM | POA: Diagnosis not present

## 2022-09-15 DIAGNOSIS — F411 Generalized anxiety disorder: Secondary | ICD-10-CM | POA: Diagnosis not present

## 2022-09-15 DIAGNOSIS — F332 Major depressive disorder, recurrent severe without psychotic features: Secondary | ICD-10-CM

## 2022-09-15 NOTE — Progress Notes (Cosign Needed)
Pt reported to [facility] for his 5th [place] treatment for Major Depressive Disorder. Pt presented with bright affect, very pleasant. Pt was positioned in the chair based on measurements from his mapping. We started at 120% and ended today's treatment at a power of 120% MT. We discussed music and the shows at the [place] center downtown. Pt reported no pain during treatment, though there was moderate twitching of the left eye and jaw. Pt left treatment stable and without complaint.

## 2022-09-16 ENCOUNTER — Other Ambulatory Visit (HOSPITAL_BASED_OUTPATIENT_CLINIC_OR_DEPARTMENT_OTHER): Payer: BC Managed Care – PPO | Admitting: *Deleted

## 2022-09-16 DIAGNOSIS — F329 Major depressive disorder, single episode, unspecified: Secondary | ICD-10-CM | POA: Diagnosis not present

## 2022-09-16 DIAGNOSIS — F411 Generalized anxiety disorder: Secondary | ICD-10-CM | POA: Diagnosis not present

## 2022-09-16 DIAGNOSIS — F332 Major depressive disorder, recurrent severe without psychotic features: Secondary | ICD-10-CM | POA: Diagnosis not present

## 2022-09-16 NOTE — Progress Notes (Cosign Needed)
Pt reported to [facility] for his 6th [place] treatment for Major Depressive Disorder. Pt presented with bright affect, very pleasant. Pt was positioned in the chair based on measurements from his mapping. We started at 120% and ended today's treatment at a power of 120% MT. Pt reported no pain during treatment, though there was moderate twitching of the left eye and jaw. Pt left treatment stable and without complaint.

## 2022-09-17 ENCOUNTER — Telehealth (HOSPITAL_COMMUNITY): Payer: Self-pay | Admitting: Psychiatry

## 2022-09-17 ENCOUNTER — Other Ambulatory Visit (HOSPITAL_BASED_OUTPATIENT_CLINIC_OR_DEPARTMENT_OTHER): Payer: BC Managed Care – PPO | Admitting: *Deleted

## 2022-09-17 DIAGNOSIS — F411 Generalized anxiety disorder: Secondary | ICD-10-CM | POA: Diagnosis not present

## 2022-09-17 DIAGNOSIS — F332 Major depressive disorder, recurrent severe without psychotic features: Secondary | ICD-10-CM

## 2022-09-17 DIAGNOSIS — F329 Major depressive disorder, single episode, unspecified: Secondary | ICD-10-CM | POA: Diagnosis not present

## 2022-09-17 NOTE — Telephone Encounter (Signed)
D:  The Interim [place] Coordinator placed a courtesy call to pt to see if he had any questions/concerns, but there was no answer.  A:  Coordinator left her name and phone # in case pt has any questions/concerns.  Inform [place] team.

## 2022-09-17 NOTE — Progress Notes (Cosign Needed)
Pt reported to [facility] for his 7th [place] treatment for Major Depressive Disorder. Pt presented with bright affect, very pleasant. Pt was positioned in the chair based on measurements from his mapping. We started at 120% and ended today's treatment at a power of 120% MT. Pt reported no pain during treatment, though there was moderate twitching of the left eye and jaw. Pt left treatment stable and without complaint.  Today's PHQ- 9 is a 20.

## 2022-09-20 ENCOUNTER — Other Ambulatory Visit (HOSPITAL_COMMUNITY): Payer: BC Managed Care – PPO | Attending: Psychiatry | Admitting: *Deleted

## 2022-09-20 DIAGNOSIS — F332 Major depressive disorder, recurrent severe without psychotic features: Secondary | ICD-10-CM | POA: Diagnosis not present

## 2022-09-20 NOTE — Progress Notes (Signed)
Pt reported to [facility] for his 8th [place] treatment for Major Depressive Disorder. Pt presented with bright affect, very pleasant. Pt was positioned in the chair based on measurements from his mapping. We started at 120% and ended today's treatment at a power of 120% MT. Pt reported no pain during treatment, though there was moderate twitching of the left eye and jaw. Pt reported having a good weekend, seeing a band and having some [place] Day fun. Pt left treatment stable and without complaint.

## 2022-09-21 ENCOUNTER — Other Ambulatory Visit (HOSPITAL_BASED_OUTPATIENT_CLINIC_OR_DEPARTMENT_OTHER): Payer: BC Managed Care – PPO | Admitting: *Deleted

## 2022-09-21 DIAGNOSIS — F332 Major depressive disorder, recurrent severe without psychotic features: Secondary | ICD-10-CM

## 2022-09-21 DIAGNOSIS — F329 Major depressive disorder, single episode, unspecified: Secondary | ICD-10-CM | POA: Diagnosis not present

## 2022-09-21 DIAGNOSIS — F411 Generalized anxiety disorder: Secondary | ICD-10-CM | POA: Diagnosis not present

## 2022-09-21 NOTE — Progress Notes (Cosign Needed)
Pt reported to [facility] for his 9th [place] treatment for Major Depressive Disorder. Pt presented with bright affect, very pleasant. Pt was positioned in the chair based on measurements from his mapping. We started at 120% and ended today's treatment at a power of 120% MT. Pt reported no pain during treatment, though there was moderate twitching of the left eye and jaw. Pt watched HGTV throughout treatment. Pt left treatment stable and without complaint.

## 2022-09-22 ENCOUNTER — Other Ambulatory Visit (HOSPITAL_BASED_OUTPATIENT_CLINIC_OR_DEPARTMENT_OTHER): Payer: BC Managed Care – PPO | Admitting: *Deleted

## 2022-09-22 DIAGNOSIS — F332 Major depressive disorder, recurrent severe without psychotic features: Secondary | ICD-10-CM

## 2022-09-22 DIAGNOSIS — F411 Generalized anxiety disorder: Secondary | ICD-10-CM | POA: Diagnosis not present

## 2022-09-22 DIAGNOSIS — F329 Major depressive disorder, single episode, unspecified: Secondary | ICD-10-CM | POA: Diagnosis not present

## 2022-09-22 NOTE — Progress Notes (Cosign Needed)
Pt reported to [facility] for his 10th [place] treatment for Major Depressive Disorder. Pt presented with bright affect, very pleasant. Pt did comment that he noticed he's been more "testy" about small things, like traffic. He has read online that that is common early in treatment. Pt was positioned in the chair based on measurements from his mapping. We started at 120% and ended today's treatment at a power of 120% MT. Pt reported no pain during treatment, though there was moderate twitching of the left eye and jaw. Pt watched HGTV throughout treatment. Pt left treatment stable and without complaint.

## 2022-09-23 ENCOUNTER — Other Ambulatory Visit (HOSPITAL_BASED_OUTPATIENT_CLINIC_OR_DEPARTMENT_OTHER): Payer: BC Managed Care – PPO | Admitting: *Deleted

## 2022-09-23 DIAGNOSIS — F329 Major depressive disorder, single episode, unspecified: Secondary | ICD-10-CM | POA: Diagnosis not present

## 2022-09-23 DIAGNOSIS — F411 Generalized anxiety disorder: Secondary | ICD-10-CM | POA: Diagnosis not present

## 2022-09-23 DIAGNOSIS — F332 Major depressive disorder, recurrent severe without psychotic features: Secondary | ICD-10-CM

## 2022-09-23 NOTE — Progress Notes (Cosign Needed)
Pt reported to [facility] for his 11th [place] treatment for Major Depressive Disorder. Pt presented with appropriate affect. Pt was positioned in the chair based on measurements from his mapping. We started at 120% and ended today's treatment at a power of 120% MT. Pt reported no pain during treatment, though there was moderate twitching of the left eye and jaw. Pt watched HGTV throughout treatment. Pt left treatment stable and without complaint.

## 2022-09-24 ENCOUNTER — Other Ambulatory Visit (HOSPITAL_BASED_OUTPATIENT_CLINIC_OR_DEPARTMENT_OTHER): Payer: BC Managed Care – PPO | Admitting: *Deleted

## 2022-09-24 DIAGNOSIS — F329 Major depressive disorder, single episode, unspecified: Secondary | ICD-10-CM | POA: Diagnosis not present

## 2022-09-24 DIAGNOSIS — F332 Major depressive disorder, recurrent severe without psychotic features: Secondary | ICD-10-CM

## 2022-09-24 DIAGNOSIS — F411 Generalized anxiety disorder: Secondary | ICD-10-CM | POA: Diagnosis not present

## 2022-09-24 DIAGNOSIS — E785 Hyperlipidemia, unspecified: Secondary | ICD-10-CM | POA: Diagnosis not present

## 2022-09-24 NOTE — Progress Notes (Cosign Needed)
Pt reported to [facility] for his 12th [place] treatment for Major Depressive Disorder. Pt presented with appropriate affect. Pt was positioned in the chair based on measurements from his mapping. We started at 120% and ended today's treatment at a power of 120% MT. Pt reports that he's noticed that he's been a little "testy" at work. Pt reported no pain during treatment, though there was moderate twitching of the left eye and jaw. Pt watched HGTV throughout treatment. Pt left treatment stable and without complaint. PHQ-9 is a 15.

## 2022-09-27 ENCOUNTER — Other Ambulatory Visit (HOSPITAL_COMMUNITY): Payer: BC Managed Care – PPO | Attending: Psychiatry | Admitting: *Deleted

## 2022-09-27 DIAGNOSIS — F332 Major depressive disorder, recurrent severe without psychotic features: Secondary | ICD-10-CM | POA: Diagnosis not present

## 2022-09-27 NOTE — Progress Notes (Cosign Needed)
Pt reported to [facility] for his 13th [place] treatment for Major Depressive Disorder. Pt presented with appropriate affect. Pt was positioned in the chair based on measurements from his mapping. We started at 120% and ended today's treatment at a power of 120% MT. Pt reports that he's been less "testy" at work. Pt reported no pain during treatment, though there was moderate twitching of the left eye and jaw. Pt watched HGTV throughout treatment. Pt left treatment stable and without complaint.

## 2022-09-28 ENCOUNTER — Other Ambulatory Visit (HOSPITAL_BASED_OUTPATIENT_CLINIC_OR_DEPARTMENT_OTHER): Payer: BC Managed Care – PPO | Admitting: *Deleted

## 2022-09-28 DIAGNOSIS — F329 Major depressive disorder, single episode, unspecified: Secondary | ICD-10-CM | POA: Diagnosis not present

## 2022-09-28 DIAGNOSIS — F332 Major depressive disorder, recurrent severe without psychotic features: Secondary | ICD-10-CM | POA: Diagnosis not present

## 2022-09-28 DIAGNOSIS — F411 Generalized anxiety disorder: Secondary | ICD-10-CM | POA: Diagnosis not present

## 2022-09-28 NOTE — Progress Notes (Cosign Needed)
Pt reported to [facility] for his 14th [place] treatment for Major Depressive Disorder. Pt presented with appropriate affect. Pt was positioned in the chair based on measurements from his mapping. We started at 120% and ended today's treatment at a power of 120% MT. Pt reports that having a good day so far. He's looking forward to his wife's birthday on Thursday and spending time together on Friday. Pt reported no pain during treatment, though there was moderate twitching of the left eye and jaw. Pt watched HGTV throughout treatment. Pt left treatment stable and without complaint.

## 2022-09-29 ENCOUNTER — Other Ambulatory Visit (HOSPITAL_BASED_OUTPATIENT_CLINIC_OR_DEPARTMENT_OTHER): Payer: BC Managed Care – PPO

## 2022-09-29 DIAGNOSIS — F411 Generalized anxiety disorder: Secondary | ICD-10-CM | POA: Diagnosis not present

## 2022-09-29 DIAGNOSIS — F332 Major depressive disorder, recurrent severe without psychotic features: Secondary | ICD-10-CM | POA: Diagnosis not present

## 2022-09-29 DIAGNOSIS — F329 Major depressive disorder, single episode, unspecified: Secondary | ICD-10-CM | POA: Diagnosis not present

## 2022-09-29 NOTE — Progress Notes (Cosign Needed)
Pt reported to [facility] for his 15th  [place] treatment for Major Depressive Disorder. Pt presented with appropriate affect. Pt was positioned in the chair based on measurements from his mapping. We started at 120% and ended today's treatment at a power of 120% MT. He's looking forward to his wife's birthday on Thursday and spending time together on Friday. Pt reported no pain during treatment, though there was moderate twitching of the left eye and jaw. Pt watched HGTV throughout treatment. Pt left treatment stable and without complaint.

## 2022-09-30 ENCOUNTER — Other Ambulatory Visit (HOSPITAL_BASED_OUTPATIENT_CLINIC_OR_DEPARTMENT_OTHER): Payer: BC Managed Care – PPO | Admitting: *Deleted

## 2022-09-30 DIAGNOSIS — F329 Major depressive disorder, single episode, unspecified: Secondary | ICD-10-CM | POA: Diagnosis not present

## 2022-09-30 DIAGNOSIS — F332 Major depressive disorder, recurrent severe without psychotic features: Secondary | ICD-10-CM | POA: Diagnosis not present

## 2022-09-30 DIAGNOSIS — F411 Generalized anxiety disorder: Secondary | ICD-10-CM | POA: Diagnosis not present

## 2022-09-30 NOTE — Progress Notes (Cosign Needed)
Pt reported to [facility] for his 16th [place] treatment for Major Depressive Disorder. Pt presented with appropriate affect. Pt was positioned in the chair based on measurements from his mapping. We started at 120% and ended today's treatment at a power of 120% MT. Pt reported no pain during treatment, though there was moderate twitching of the left eye and jaw. Pt watched HGTV throughout treatment. Pt left treatment stable and without complaint.

## 2022-10-01 ENCOUNTER — Other Ambulatory Visit (HOSPITAL_BASED_OUTPATIENT_CLINIC_OR_DEPARTMENT_OTHER): Payer: BC Managed Care – PPO | Admitting: *Deleted

## 2022-10-01 DIAGNOSIS — F329 Major depressive disorder, single episode, unspecified: Secondary | ICD-10-CM | POA: Diagnosis not present

## 2022-10-01 DIAGNOSIS — F332 Major depressive disorder, recurrent severe without psychotic features: Secondary | ICD-10-CM | POA: Diagnosis not present

## 2022-10-01 DIAGNOSIS — F411 Generalized anxiety disorder: Secondary | ICD-10-CM | POA: Diagnosis not present

## 2022-10-01 NOTE — Progress Notes (Cosign Needed)
Pt reported to [facility] for his 17th [place] treatment for Major Depressive Disorder. Pt presented with appropriate affect. Pt was positioned in the chair based on measurements from his mapping. We started at 120% and ended today's treatment at a power of 120% MT. Pt reported no pain during treatment, though there was moderate twitching of the left eye and jaw. Pt came early today so that he and his wife can travel to [place] to visit his mother-in-law. Pt watched HGTV throughout treatment. Pt left treatment stable and without complaint.

## 2022-10-04 ENCOUNTER — Other Ambulatory Visit (HOSPITAL_COMMUNITY): Payer: BC Managed Care – PPO | Attending: Psychiatry | Admitting: *Deleted

## 2022-10-04 DIAGNOSIS — F411 Generalized anxiety disorder: Secondary | ICD-10-CM | POA: Insufficient documentation

## 2022-10-04 DIAGNOSIS — F339 Major depressive disorder, recurrent, unspecified: Secondary | ICD-10-CM | POA: Diagnosis not present

## 2022-10-04 DIAGNOSIS — F332 Major depressive disorder, recurrent severe without psychotic features: Secondary | ICD-10-CM | POA: Diagnosis not present

## 2022-10-04 NOTE — Progress Notes (Cosign Needed)
Pt reported to [facility] for his 18th [place] treatment for Major Depressive Disorder. Pt presented with appropriate affect. Pt was positioned in the chair based on measurements from his mapping. We started at 120% and ended today's treatment at a power of 120% MT. Pt reported no pain during treatment, though there was moderate twitching of the left eye and jaw. Pt reports having a nice weekend. Pt watched HGTV throughout treatment. Pt left treatment stable and without complaint.

## 2022-10-05 ENCOUNTER — Other Ambulatory Visit (HOSPITAL_COMMUNITY): Payer: BC Managed Care – PPO | Attending: Psychiatry | Admitting: *Deleted

## 2022-10-05 DIAGNOSIS — F332 Major depressive disorder, recurrent severe without psychotic features: Secondary | ICD-10-CM

## 2022-10-05 NOTE — Progress Notes (Cosign Needed)
Pt reported to [facility] for his 19th [place] treatment for Major Depressive Disorder. Pt presented with appropriate affect. Pt was positioned in the chair based on measurements from his mapping. We started at 120% and ended today's treatment at a power of 120% MT. Pt reported no pain during treatment, though there was moderate twitching of the left eye and jaw. Pt reports having a nice weekend. Pt watched HGTV throughout treatment. Pt left treatment stable and without complaint.

## 2022-10-06 ENCOUNTER — Other Ambulatory Visit (HOSPITAL_COMMUNITY): Payer: BC Managed Care – PPO

## 2022-10-07 ENCOUNTER — Other Ambulatory Visit (HOSPITAL_COMMUNITY): Payer: BC Managed Care – PPO

## 2022-10-08 ENCOUNTER — Other Ambulatory Visit (HOSPITAL_COMMUNITY): Payer: BC Managed Care – PPO | Attending: Psychiatry | Admitting: *Deleted

## 2022-10-08 DIAGNOSIS — F332 Major depressive disorder, recurrent severe without psychotic features: Secondary | ICD-10-CM | POA: Insufficient documentation

## 2022-10-08 NOTE — Progress Notes (Cosign Needed)
Pt reported to [facility] for his 20th [place] treatment for Major Depressive Disorder. Pt presented with appropriate affect. Pt was positioned in the chair based on measurements from his mapping. We started at 120% and ended today's treatment at a power of 120% MT. Pt reported no pain during treatment, though there was moderate twitching of the left eye and jaw. Pt reports he's lookinmg forward to traveling to [facility] tomorrow for [employer] eclipse on Monday. He will miss Monday and Tuesday next week. Pt watched HGTV throughout treatment. Pt left treatment stable and without complaint. PHQ-9 today is a 9.

## 2022-10-09 ENCOUNTER — Telehealth (HOSPITAL_COMMUNITY): Payer: Self-pay | Admitting: Psychiatry

## 2022-10-09 NOTE — Telephone Encounter (Signed)
D:  Case manager placed a courtesy TMS call to check on patient to see how TMS was going for him and to see if he had any questions/concerns, but there was no answer.  Left case manager's phone #.  A:  Inform TMS team.

## 2022-10-11 ENCOUNTER — Other Ambulatory Visit (HOSPITAL_COMMUNITY): Payer: BC Managed Care – PPO

## 2022-10-12 ENCOUNTER — Other Ambulatory Visit (HOSPITAL_COMMUNITY): Payer: BC Managed Care – PPO

## 2022-10-13 ENCOUNTER — Other Ambulatory Visit (HOSPITAL_COMMUNITY): Payer: BC Managed Care – PPO | Attending: Psychiatry | Admitting: *Deleted

## 2022-10-13 DIAGNOSIS — F332 Major depressive disorder, recurrent severe without psychotic features: Secondary | ICD-10-CM | POA: Insufficient documentation

## 2022-10-13 NOTE — Progress Notes (Cosign Needed)
Pt reported to [facility] for his 21st [place] treatment for Major Depressive Disorder. Pt presented with appropriate affect. Pt was positioned in the chair based on measurements from his mapping. We started at 120% and ended today's treatment at a power of 120% MT. Pt reported no pain during treatment, though there was moderate twitching of the left eye and jaw. Pt reports he had a great time in [place], [place] watching the solar eclipse with his wife. He felt like his mood stayed even throughout despite missing a few days of treatment. Pt watched HGTV throughout treatment. Pt left treatment stable and without complaint.

## 2022-10-14 ENCOUNTER — Other Ambulatory Visit (HOSPITAL_COMMUNITY): Payer: BC Managed Care – PPO | Attending: Psychiatry | Admitting: *Deleted

## 2022-10-14 DIAGNOSIS — F332 Major depressive disorder, recurrent severe without psychotic features: Secondary | ICD-10-CM | POA: Diagnosis not present

## 2022-10-14 NOTE — Progress Notes (Cosign Needed)
Pt reported to [facility] for his 22nd [place] treatment for Major Depressive Disorder. Pt presented with appropriate affect. Pt was positioned in the chair based on measurements from his mapping. We started at 120% and ended today's treatment at a power of 120% MT. Pt reported no pain during treatment, though there was moderate twitching of the left eye and jaw.Pt watched HGTV throughout treatment. Pt left treatment stable and without complaint.

## 2022-10-15 ENCOUNTER — Other Ambulatory Visit (HOSPITAL_COMMUNITY): Payer: BC Managed Care – PPO | Attending: Psychiatry | Admitting: *Deleted

## 2022-10-15 DIAGNOSIS — F332 Major depressive disorder, recurrent severe without psychotic features: Secondary | ICD-10-CM | POA: Insufficient documentation

## 2022-10-15 NOTE — Progress Notes (Cosign Needed)
Pt reported to [facility] for his 23rd [place] treatment for Major Depressive Disorder. Pt presented with appropriate affect. Pt was positioned in the chair based on measurements from his mapping. We started at 120% and ended today's treatment at a power of 120% MT. Pt reported no pain during treatment, though there was moderate twitching of the left eye and jaw.Pt watched HGTV throughout treatment. Pt left treatment stable and without complaint. Pt reports no plans for the weekend. PHQ-9 today a 9.

## 2022-10-18 ENCOUNTER — Other Ambulatory Visit (HOSPITAL_COMMUNITY): Payer: BC Managed Care – PPO | Attending: Psychiatry | Admitting: *Deleted

## 2022-10-18 DIAGNOSIS — F332 Major depressive disorder, recurrent severe without psychotic features: Secondary | ICD-10-CM | POA: Diagnosis not present

## 2022-10-18 NOTE — Progress Notes (Cosign Needed)
Pt reported to [facility] for his 24nd [place] treatment for Major Depressive Disorder. Pt presented with appropriate affect. Pt was positioned in the chair based on measurements from his mapping. We started at 120% and ended today's treatment at a power of 120% MT. Pt reported no pain during treatment, though there was moderate twitching of the left eye and jaw.Pt watched HGTV throughout treatment. Pt left treatment stable and without complaint. Pt reported he had a good weekend and that he's looking forward to [place] later this month.

## 2022-10-19 ENCOUNTER — Other Ambulatory Visit (HOSPITAL_BASED_OUTPATIENT_CLINIC_OR_DEPARTMENT_OTHER): Payer: BC Managed Care – PPO | Admitting: *Deleted

## 2022-10-19 DIAGNOSIS — F332 Major depressive disorder, recurrent severe without psychotic features: Secondary | ICD-10-CM | POA: Diagnosis not present

## 2022-10-19 NOTE — Progress Notes (Cosign Needed)
Pt reported to [facility] for his 25th [place] treatment for Major Depressive Disorder. Pt presented with appropriate affect. Pt was positioned in the chair based on measurements from his mapping. We started at 120% and ended today's treatment at a power of 120% MT. Pt reported no pain during treatment, though there was moderate twitching of the left eye and jaw. Pt watched HGTV throughout treatment. Pt left treatment stable and without complaint. Pt reported that he has noticed that he is enjoying activities again, which he attributes to the TMS treatments.

## 2022-10-20 ENCOUNTER — Other Ambulatory Visit (HOSPITAL_BASED_OUTPATIENT_CLINIC_OR_DEPARTMENT_OTHER): Payer: BC Managed Care – PPO | Admitting: *Deleted

## 2022-10-20 DIAGNOSIS — F332 Major depressive disorder, recurrent severe without psychotic features: Secondary | ICD-10-CM | POA: Diagnosis not present

## 2022-10-20 NOTE — Progress Notes (Cosign Needed)
Pt reported to [facility] for his 26th [place] treatment for Major Depressive Disorder. Pt presented with appropriate affect. Pt was positioned in the chair based on measurements from his mapping. We started at 120% and ended today's treatment at a power of 120% MT. Pt reported no pain during treatment, though there was moderate twitching of the left eye and jaw. Pt watched HGTV throughout treatment.

## 2022-10-21 ENCOUNTER — Other Ambulatory Visit (HOSPITAL_BASED_OUTPATIENT_CLINIC_OR_DEPARTMENT_OTHER): Payer: BC Managed Care – PPO | Admitting: *Deleted

## 2022-10-21 DIAGNOSIS — F332 Major depressive disorder, recurrent severe without psychotic features: Secondary | ICD-10-CM | POA: Diagnosis not present

## 2022-10-21 NOTE — Progress Notes (Cosign Needed)
Pt reported to [facility] for his 27th [place] treatment for Major Depressive Disorder. Pt presented with appropriate affect. Pt was positioned in the chair based on measurements from his mapping. We started at 120% and ended today's treatment at a power of 120% MT. Pt reported no pain during treatment, though there was moderate twitching of the left eye and jaw. Pt watched HGTV throughout treatment.

## 2022-10-22 ENCOUNTER — Other Ambulatory Visit (HOSPITAL_COMMUNITY): Payer: BC Managed Care – PPO | Admitting: *Deleted

## 2022-10-22 DIAGNOSIS — F332 Major depressive disorder, recurrent severe without psychotic features: Secondary | ICD-10-CM

## 2022-10-22 NOTE — Progress Notes (Cosign Needed)
Pt reported to [facility] for his 28th [place] treatment for Major Depressive Disorder. Pt presented with appropriate affect. Pt was positioned in the chair based on measurements from his mapping. We started at 120% and ended today's treatment at a power of 120% MT. Pt reported no pain during treatment, though there was moderate twitching of the left eye and jaw. Pt watched HGTV throughout treatment. PHQ-9 today is a 12, which is a small increase from the past several weeks. He reports some work related stress this week, but did not elaborate.

## 2022-10-25 ENCOUNTER — Other Ambulatory Visit (HOSPITAL_COMMUNITY): Payer: BC Managed Care – PPO | Attending: Psychiatry | Admitting: *Deleted

## 2022-10-25 DIAGNOSIS — F332 Major depressive disorder, recurrent severe without psychotic features: Secondary | ICD-10-CM | POA: Insufficient documentation

## 2022-10-25 NOTE — Progress Notes (Cosign Needed)
Pt reported to [facility] for his 29th [place] treatment for Major Depressive Disorder. Pt presented with appropriate affect. Pt was positioned in the chair based on measurements from his mapping. We started at 120% and ended today's treatment at a power of 120% MT. Pt reported no pain during treatment, though there was moderate twitching of the left eye and jaw. Pt watched HGTV throughout treatment. Pt reported a good weekend, participated in a disc golf tournament, coming in second out of 4.

## 2022-10-26 ENCOUNTER — Other Ambulatory Visit (HOSPITAL_COMMUNITY): Payer: BC Managed Care – PPO | Attending: Psychiatry | Admitting: *Deleted

## 2022-10-26 DIAGNOSIS — F411 Generalized anxiety disorder: Secondary | ICD-10-CM | POA: Diagnosis not present

## 2022-10-26 DIAGNOSIS — F332 Major depressive disorder, recurrent severe without psychotic features: Secondary | ICD-10-CM | POA: Diagnosis not present

## 2022-10-26 DIAGNOSIS — F329 Major depressive disorder, single episode, unspecified: Secondary | ICD-10-CM | POA: Diagnosis not present

## 2022-10-26 NOTE — Progress Notes (Cosign Needed)
Pt reported to [facility] for his 30th [place] treatment for Major Depressive Disorder. Pt presented with appropriate affect. Pt was positioned in the chair based on measurements from his mapping. We started at 120% and ended today's treatment at a power of 120% MT. Pt reported no pain during treatment, though there was moderate twitching of the left eye and jaw. Pt watched HGTV throughout treatment. Today is pt's birthday. He reported it isn't a very big deal for him, but that it is to his wife. Pleased that he finished his initial 30 treatments.

## 2022-10-27 ENCOUNTER — Other Ambulatory Visit (HOSPITAL_COMMUNITY): Payer: BC Managed Care – PPO

## 2022-10-28 ENCOUNTER — Other Ambulatory Visit (HOSPITAL_COMMUNITY): Payer: BC Managed Care – PPO

## 2022-10-29 ENCOUNTER — Other Ambulatory Visit (HOSPITAL_COMMUNITY): Payer: BC Managed Care – PPO | Attending: Psychiatry

## 2022-10-29 ENCOUNTER — Encounter (HOSPITAL_COMMUNITY): Payer: Self-pay

## 2022-10-29 DIAGNOSIS — F332 Major depressive disorder, recurrent severe without psychotic features: Secondary | ICD-10-CM | POA: Diagnosis not present

## 2022-10-29 NOTE — Progress Notes (Signed)
Patient reported to [facility] for his first taper session, #31 of Repetitive Transcranial Magnetic Stimulation treatment for severe episode of recurrent major depressive disorder, without psychotic features. Patient presented with appropriate affect, level mood, denied any suicidal or homicidal ideations. No change in alcohol/substance use, caffeine consumption, sleep pattern or metal implant status since previous treatment. Power was titrated to 120% and patient completed session with no complaints of pain or discomfort.  Patient shared he had gone to a concert the previous day and major plans for the weekend but acknowledged doing well from TMS and no other concerns at this time.  Patient to return next on Monday 11/01/22 for next taper session.

## 2022-11-01 ENCOUNTER — Other Ambulatory Visit (HOSPITAL_COMMUNITY): Payer: BC Managed Care – PPO | Attending: Psychiatry

## 2022-11-01 ENCOUNTER — Encounter (HOSPITAL_COMMUNITY): Payer: Self-pay

## 2022-11-01 DIAGNOSIS — F332 Major depressive disorder, recurrent severe without psychotic features: Secondary | ICD-10-CM | POA: Diagnosis not present

## 2022-11-01 NOTE — Progress Notes (Signed)
Patient reported to [facility] for his 2nd taper session, #32 of Repetitive Transcranial Magnetic Stimulation treatment for severe episode of recurrent major depressive disorder, without psychotic features. Patient presented with appropriate affect, level mood, denied any suicidal or homicidal ideations. No change in alcohol/substance use, caffeine consumption, sleep pattern or metal implant status since previous treatment. Power was titrated to 120% and patient completed session with no complaints of pain or discomfort.  Patient reports plan to go straight through and to complete taper sessions this week.  Will return on tomorrow for 3rd taper session and denied any concerns upon leaving session today.

## 2022-11-02 ENCOUNTER — Other Ambulatory Visit (HOSPITAL_COMMUNITY): Payer: BC Managed Care – PPO | Attending: Psychiatry | Admitting: *Deleted

## 2022-11-02 DIAGNOSIS — F332 Major depressive disorder, recurrent severe without psychotic features: Secondary | ICD-10-CM | POA: Diagnosis not present

## 2022-11-02 NOTE — Progress Notes (Cosign Needed)
Pt reported to [facility] for his 33rd [place] treatment for Major Depressive Disorder. Pt presented with appropriate affect. Pt was positioned in the chair based on measurements from his mapping. We started at 120% and ended today's treatment at a power of 120% MT. Pt reported no pain during treatment, though there was moderate twitching of the left eye and jaw. Pt watched HGTV throughout treatment.

## 2022-11-03 ENCOUNTER — Other Ambulatory Visit (HOSPITAL_COMMUNITY): Payer: BC Managed Care – PPO | Attending: Psychiatry | Admitting: *Deleted

## 2022-11-03 DIAGNOSIS — F329 Major depressive disorder, single episode, unspecified: Secondary | ICD-10-CM | POA: Insufficient documentation

## 2022-11-03 DIAGNOSIS — F332 Major depressive disorder, recurrent severe without psychotic features: Secondary | ICD-10-CM

## 2022-11-03 DIAGNOSIS — F411 Generalized anxiety disorder: Secondary | ICD-10-CM | POA: Diagnosis not present

## 2022-11-03 NOTE — Progress Notes (Cosign Needed)
Pt reported to [facility] for his 34th (4th taper) TMS treatment for Major Depressive Disorder. Pt presented with appropriate affect. Pt was positioned in the chair based on measurements from his mapping. We started at 120% and ended today's treatment at a power of 120% MT. Pt reported no pain during treatment, though there was moderate twitching of the left eye and jaw. Pt watched HGTV throughout treatment.

## 2022-11-04 ENCOUNTER — Other Ambulatory Visit (HOSPITAL_BASED_OUTPATIENT_CLINIC_OR_DEPARTMENT_OTHER): Payer: BC Managed Care – PPO | Admitting: *Deleted

## 2022-11-04 DIAGNOSIS — F332 Major depressive disorder, recurrent severe without psychotic features: Secondary | ICD-10-CM | POA: Diagnosis not present

## 2022-11-04 DIAGNOSIS — F411 Generalized anxiety disorder: Secondary | ICD-10-CM | POA: Diagnosis not present

## 2022-11-04 DIAGNOSIS — F329 Major depressive disorder, single episode, unspecified: Secondary | ICD-10-CM | POA: Diagnosis not present

## 2022-11-04 NOTE — Progress Notes (Cosign Needed)
Pt reported to [facility] for his 35th (5th taper) TMS treatment for Major Depressive Disorder. Pt presented with appropriate affect. Pt was positioned in the chair based on measurements from his mapping. We started at 120% and ended today's treatment at a power of 120% MT. Pt reported no pain during treatment, though there was moderate twitching of the left eye and jaw. Pt watched HGTV throughout treatment. Pt reported that both his step-children are graduating next week.

## 2022-11-05 ENCOUNTER — Other Ambulatory Visit (HOSPITAL_BASED_OUTPATIENT_CLINIC_OR_DEPARTMENT_OTHER): Payer: BC Managed Care – PPO | Admitting: *Deleted

## 2022-11-05 DIAGNOSIS — F411 Generalized anxiety disorder: Secondary | ICD-10-CM | POA: Diagnosis not present

## 2022-11-05 DIAGNOSIS — F332 Major depressive disorder, recurrent severe without psychotic features: Secondary | ICD-10-CM

## 2022-11-05 DIAGNOSIS — F329 Major depressive disorder, single episode, unspecified: Secondary | ICD-10-CM | POA: Diagnosis not present

## 2022-11-05 NOTE — Progress Notes (Cosign Needed)
Pt reported to [facility] for his 36th (final taper) TMS treatment for Major Depressive Disorder. Pt presented with appropriate affect. Pt was positioned in the chair based on measurements from his mapping. We started at 120% and ended today's treatment at a power of 120% MT. Pt reported no pain during treatment, though there was moderate twitching of the left eye and jaw. Pt watched HGTV throughout treatment. Pt reported that his wife commented that she felt the treatments had made a big difference in his mood. He went to a [employer] game last night with some relatives and reported that he had a "really good time." PHQ-9 today is a 6.

## 2023-01-25 DIAGNOSIS — Z0000 Encounter for general adult medical examination without abnormal findings: Secondary | ICD-10-CM | POA: Diagnosis not present

## 2023-01-25 DIAGNOSIS — E785 Hyperlipidemia, unspecified: Secondary | ICD-10-CM | POA: Diagnosis not present

## 2023-01-25 DIAGNOSIS — Z125 Encounter for screening for malignant neoplasm of prostate: Secondary | ICD-10-CM | POA: Diagnosis not present
